# Patient Record
Sex: Female | Born: 1970 | Race: White | Hispanic: No | Marital: Single | Smoking: Current every day smoker
Health system: Southern US, Community
[De-identification: ages and names within clinical notes are randomized; demographics above are authoritative.]

## PROBLEM LIST (undated history)

## (undated) DIAGNOSIS — N390 Urinary tract infection, site not specified: Secondary | ICD-10-CM

## (undated) DIAGNOSIS — F329 Major depressive disorder, single episode, unspecified: Secondary | ICD-10-CM

## (undated) DIAGNOSIS — G43909 Migraine, unspecified, not intractable, without status migrainosus: Secondary | ICD-10-CM

## (undated) DIAGNOSIS — R002 Palpitations: Secondary | ICD-10-CM

## (undated) DIAGNOSIS — N200 Calculus of kidney: Secondary | ICD-10-CM

## (undated) HISTORY — PX: LAPAROSCOPIC GASTRIC SLEEVE RESECTION: SHX5895

## (undated) HISTORY — PX: CHOLECYSTECTOMY: SHX55

## (undated) HISTORY — PX: APPENDECTOMY: SHX54

## (undated) HISTORY — PX: TUBAL LIGATION: SHX77

---

## 1998-07-18 ENCOUNTER — Inpatient Hospital Stay (HOSPITAL_COMMUNITY): Admission: AD | Admit: 1998-07-18 | Discharge: 1998-07-18 | Payer: Self-pay | Admitting: Obstetrics and Gynecology

## 1998-07-19 ENCOUNTER — Inpatient Hospital Stay (HOSPITAL_COMMUNITY): Admission: AD | Admit: 1998-07-19 | Discharge: 1998-07-21 | Payer: Self-pay | Admitting: Obstetrics & Gynecology

## 1998-08-16 ENCOUNTER — Encounter: Payer: Self-pay | Admitting: Emergency Medicine

## 1998-08-16 ENCOUNTER — Emergency Department (HOSPITAL_COMMUNITY): Admission: EM | Admit: 1998-08-16 | Discharge: 1998-08-16 | Payer: Self-pay | Admitting: Emergency Medicine

## 1998-08-26 ENCOUNTER — Observation Stay (HOSPITAL_COMMUNITY): Admission: RE | Admit: 1998-08-26 | Discharge: 1998-08-27 | Payer: Self-pay | Admitting: General Surgery

## 1998-12-27 ENCOUNTER — Other Ambulatory Visit: Admission: RE | Admit: 1998-12-27 | Discharge: 1998-12-27 | Payer: Self-pay | Admitting: Obstetrics and Gynecology

## 1999-01-16 ENCOUNTER — Ambulatory Visit (HOSPITAL_COMMUNITY): Admission: RE | Admit: 1999-01-16 | Discharge: 1999-01-16 | Payer: Self-pay | Admitting: Unknown Physician Specialty

## 1999-11-03 ENCOUNTER — Encounter (INDEPENDENT_AMBULATORY_CARE_PROVIDER_SITE_OTHER): Payer: Self-pay

## 1999-11-03 ENCOUNTER — Inpatient Hospital Stay (HOSPITAL_COMMUNITY): Admission: AD | Admit: 1999-11-03 | Discharge: 1999-11-06 | Payer: Self-pay | Admitting: Obstetrics and Gynecology

## 1999-12-04 ENCOUNTER — Other Ambulatory Visit: Admission: RE | Admit: 1999-12-04 | Discharge: 1999-12-04 | Payer: Self-pay | Admitting: Obstetrics and Gynecology

## 2001-05-16 ENCOUNTER — Encounter: Payer: Self-pay | Admitting: Emergency Medicine

## 2001-05-16 ENCOUNTER — Emergency Department (HOSPITAL_COMMUNITY): Admission: EM | Admit: 2001-05-16 | Discharge: 2001-05-16 | Payer: Self-pay | Admitting: Emergency Medicine

## 2001-10-17 ENCOUNTER — Other Ambulatory Visit: Admission: RE | Admit: 2001-10-17 | Discharge: 2001-10-17 | Payer: Self-pay | Admitting: Obstetrics and Gynecology

## 2001-10-17 ENCOUNTER — Other Ambulatory Visit: Admission: RE | Admit: 2001-10-17 | Discharge: 2001-10-17 | Payer: Self-pay | Admitting: Gynecology

## 2001-12-22 ENCOUNTER — Encounter: Payer: Self-pay | Admitting: *Deleted

## 2001-12-22 ENCOUNTER — Encounter: Admission: RE | Admit: 2001-12-22 | Discharge: 2001-12-22 | Payer: Self-pay | Admitting: *Deleted

## 2001-12-23 ENCOUNTER — Ambulatory Visit (HOSPITAL_COMMUNITY): Admission: AD | Admit: 2001-12-23 | Discharge: 2001-12-23 | Payer: Self-pay | Admitting: Urology

## 2002-04-28 ENCOUNTER — Ambulatory Visit (HOSPITAL_COMMUNITY): Admission: RE | Admit: 2002-04-28 | Discharge: 2002-04-28 | Payer: Self-pay | Admitting: Obstetrics and Gynecology

## 2002-10-27 ENCOUNTER — Ambulatory Visit (HOSPITAL_COMMUNITY): Admission: RE | Admit: 2002-10-27 | Discharge: 2002-10-27 | Payer: Self-pay | Admitting: Obstetrics and Gynecology

## 2003-09-06 ENCOUNTER — Ambulatory Visit (HOSPITAL_COMMUNITY): Admission: AD | Admit: 2003-09-06 | Discharge: 2003-09-06 | Payer: Self-pay | Admitting: Urology

## 2003-10-25 ENCOUNTER — Other Ambulatory Visit: Admission: RE | Admit: 2003-10-25 | Discharge: 2003-10-25 | Payer: Self-pay | Admitting: Obstetrics and Gynecology

## 2004-11-04 ENCOUNTER — Other Ambulatory Visit: Admission: RE | Admit: 2004-11-04 | Discharge: 2004-11-04 | Payer: Self-pay | Admitting: Obstetrics and Gynecology

## 2010-06-01 ENCOUNTER — Emergency Department (HOSPITAL_BASED_OUTPATIENT_CLINIC_OR_DEPARTMENT_OTHER): Admission: EM | Admit: 2010-06-01 | Discharge: 2010-06-01 | Payer: Self-pay | Admitting: Emergency Medicine

## 2010-12-04 LAB — DIFFERENTIAL
Basophils Absolute: 0.1 10*3/uL (ref 0.0–0.1)
Basophils Relative: 1 % (ref 0–1)
Eosinophils Absolute: 0.3 10*3/uL (ref 0.0–0.7)
Eosinophils Relative: 3 % (ref 0–5)
Lymphocytes Relative: 23 % (ref 12–46)
Lymphs Abs: 2.4 10*3/uL (ref 0.7–4.0)
Monocytes Absolute: 0.6 10*3/uL (ref 0.1–1.0)
Monocytes Relative: 6 % (ref 3–12)
Neutro Abs: 7.1 10*3/uL (ref 1.7–7.7)
Neutrophils Relative %: 67 % (ref 43–77)

## 2010-12-04 LAB — BASIC METABOLIC PANEL
BUN: 12 mg/dL (ref 6–23)
CO2: 25 mEq/L (ref 19–32)
Calcium: 9.1 mg/dL (ref 8.4–10.5)
Chloride: 108 mEq/L (ref 96–112)
Creatinine, Ser: 0.7 mg/dL (ref 0.4–1.2)
GFR calc Af Amer: >60 mL/min (ref >60)
GFR calc non Af Amer: >60 mL/min (ref >60)
Glucose, Bld: 94 mg/dL (ref 70–99)
Potassium: 4.1 mEq/L (ref 3.5–5.1)
Sodium: 141 mEq/L (ref 135–145)

## 2010-12-04 LAB — URINALYSIS, ROUTINE W REFLEX MICROSCOPIC
Bilirubin Urine: NEGATIVE
Glucose, UA: NEGATIVE mg/dL
Hgb urine dipstick: NEGATIVE
Ketones, ur: 15 mg/dL — AB
Nitrite: NEGATIVE
Protein, ur: NEGATIVE mg/dL
Specific Gravity, Urine: 1.017 (ref 1.005–1.030)
Urobilinogen, UA: 0.2 mg/dL (ref 0.0–1.0)
pH: 6.5 (ref 5.0–8.0)

## 2010-12-04 LAB — CBC
HCT: 39.8 % (ref 36.0–46.0)
Hemoglobin: 13.5 g/dL (ref 12.0–15.0)
MCH: 31.1 pg (ref 26.0–34.0)
MCHC: 33.9 g/dL (ref 30.0–36.0)
MCV: 91.7 fL (ref 78.0–100.0)
Platelets: 248 10*3/uL (ref 150–400)
RBC: 4.34 MIL/uL (ref 3.87–5.11)
RDW: 13.1 % (ref 11.5–15.5)
WBC: 10.5 10*3/uL (ref 4.0–10.5)

## 2010-12-04 LAB — PREGNANCY, URINE: Preg Test, Ur: NEGATIVE

## 2011-02-06 NOTE — Op Note (Signed)
NAME:  Courtney Landry, Courtney Landry                      ACCOUNT NO.:  [id number]   MEDICAL RECORD NO.:  [id number]                   PATIENT TYPE:  AMB   LOCATION:  SDC                                  FACILITY:  [facility]   PHYSICIAN:  Miguel Aschoff, M.D.                    DATE OF BIRTH:  21-May-1971   DATE OF PROCEDURE:  10/27/2002  DATE OF DISCHARGE:                                 OPERATIVE REPORT   PREOPERATIVE DIAGNOSES:  Extensive vulvar and perianal condyloma acuminata.   POSTOPERATIVE DIAGNOSES:  Extensive vulvar and perianal condyloma acuminata.   PROCEDURE:  Laser vaporization of condyloma acuminata.   SURGEON:  Miguel Aschoff, M.D.   ANESTHESIA:  General.   COMPLICATIONS:  None.   JUSTIFICATION:  The patient is a 40 year old white female who has had a  seven month history of condyloma acuminata which have been resistant to  treatment using podophyllin and liquid nitrogen.  The condylomata are  amassed at the area of the fourchette, vulvar creases, and then perianal  region and because of their resistance to therapy on the outpatient basis  she presents now to undergo CO2 vaporization of the vulvar condylomata.  Risks and benefits of the procedure were discussed with patient and informed  consent has been obtained.   PROCEDURE:  The patient was taken to the operating room.  Placed in the  supine position and general anesthesia was administered without difficulty.  She was then placed in the dorsal lithotomy position and prepped and draped  in the usual sterile fashion.  Wet towels were placed around the vulva and  below the buttocks and then using the CO2 laser with 15 watts of power with  a handheld wand each of the condylomata were vaporized without difficulty  and with care to keep the lasered area in the superficial layer of the skin.  All visible lesions were laser vaporized at the fourchette, along the vulva,  the labia, and in the perianal region.  The estimated blood loss was  less  than 5 mL.  The patient tolerated the procedure well.  Went to the recovery  room in satisfactory condition.  Plan is for the patient to be discharged  home.  Her medications for home include Tylox one q.3h. as needed for pain.  She is instructed to use sitz baths t.i.d. with sea salts and Silvadene  cream b.i.d. to the area.  Seen back in two weeks for follow-up examination.  She is to call if there are any problems such as fever, back pain, or heavy  bleeding.                                               Miguel Aschoff, M.D.    AR/MEDQ  D:  10/27/2002  T:  10/27/2002  Job:  [id number]

## 2011-02-06 NOTE — Op Note (Signed)
NAME:  Courtney Landry, Courtney Landry                      ACCOUNT NO.:  [id number]   MEDICAL RECORD NO.:  [id number]                   PATIENT TYPE:  AMB   LOCATION:  SDC                                  FACILITY:  [facility]   PHYSICIAN:  Miguel Aschoff, M.D.                    DATE OF BIRTH:  Jun 19, 1971   DATE OF PROCEDURE:  04/28/2002  DATE OF DISCHARGE:                                 OPERATIVE REPORT   PREOPERATIVE DIAGNOSES:  1. Desires sterilization.  2. Perineal condylomata acuminata.   POSTOPERATIVE DIAGNOSES:  1. Desires sterilization.  2. Perineal condylomata acuminata.  3. Multiple pelvic adhesions.   PROCEDURES:  1. Laparoscopy with laparoscopic tubal sterilization using cautery and     division.  2. Lysis of adhesions.  3. Cautery of perineal condylomata acuminata.   SURGEON:  Miguel Aschoff, M.D.   ANESTHESIA:  General.   COMPLICATIONS:  None.   JUSTIFICATION:  The patient is a 40 year old white female gravida 4, para 3-  0-1-2, requesting sterilization for socioeconomic reasons.  The risks and  benefits of the procedure were discussed with the patient.  She understands  that this should result in her being sterile but that this could not be  guaranteed.  In addition, the patient reported that she had some lesions  noted at the opening of the vagina and exam revealed the presence of  condylomata acuminata.  The patient has requested that these be treated at  the time of her tubal sterilization.   PROCEDURE:  The patient was taken to the operating room, placed in a supine  position, and general anesthesia was administered without difficulty.  After  this was done, she was placed in the dorsal lithotomy position, prepped and  draped in the usual sterile fashion.  The bladder was catheterized.  A Hulka  tenaculum was placed through the cervix and held.  Attention was then  directed to the umbilicus where a small infraumbilical incision was made.  Veress needle was then inserted and  the abdomen was insufflated with three  liters of CO2.  Following the insufflation, the trocars were placed followed  by the laparoscope itself.  Then under direct visualization, a second 5-mm  suprapubic port was established.  On examination, the patient was noted to  have a band of adhesions running from the omentum to the anterior abdominal  wall.  This was lysed without difficulty sharply.  In addition, the patient  had marked and thick adhesions of the anterior uterine wall and bladder dome  to the anterior abdominal wall.  This  is a result of the patient's three  prior cesarean sections.  These were thick in nature and these were not  treated at this time since the patient had no complaints of pelvic pain and  for fear of injury to adjacent structures such as the bladder.  At this  point, the uterus was elevated, the right  tube was brought into view,  cauterized in its midportion with bipolar cautery for about 3 cm, and then  the tube was divided with laparoscopic scissors.  Then, the identical  procedure was carried out on the left side with cauterization and division  of the tube with good separation and good hemostasis.  At this point, with  no other abnormalities being noted, this portion of the procedure was  completed, the CO2 was allowed to escape.  The small incisions were closed  using subcuticular 4-0 Vicryl and the port sites were injected with 0.25%  Marcaine.  Attention was then directed to the perineum where the patient had  multiple condylomata acuminata.  Using electrocautery with 50 watts of  cautery power, these lesions were removed without difficulty.  At this  point, the procedure was completed.  The patient was taken to the recovery  room in satisfactory condition.  Estimated blood loss was less than 20 cc.  The patient tolerated the procedure well.  Plans for the patient to be  discharged home.  She should be seen back in four weeks for follow up  examination.   Medications for home include Tylox 1 q.3h. as needed for pain,  Silvadene cream in the area of the cautery of the warts.                                                 Miguel Aschoff, M.D.    AR/MEDQ  D:  04/28/2002  T:  04/29/2002  Job:  [id number]

## 2011-02-06 NOTE — Op Note (Signed)
[facility]  Patient:    LYNDAL, ALAMILLO Visit Number: [id number] MRN: [id number]          Service Type: DSU Location: DAY Attending Physician:  Katherine Roan Dictated by:   Rozanna Boer., M.D. Proc. Date: 12/23/01 Admit Date:  12/23/2001                             Operative Report  PREOPERATIVE DIAGNOSIS:  Right distal ureteral stone.  POSTOPERATIVE DIAGNOSIS:  Right distal ureteral stone.  OPERATION:  Ureteroscopy with holmium lasertripsy and stone removal.  ANESTHESIA:  General.  SURGEON:  Rozanna Boer., M.D.  BRIEF HISTORY:  This 40 year old white female presented with acute flank pain yesterday, April 3. She had a CT scan which showed a right distal stone that had moved even further distally today. She is not doing well with pain medicine, throwing up, cannot keep these down, and has not been able to be controlled with even intramuscular pain medicine. She enters now for ureteroscopy and stone extraction.  DESCRIPTION OF PROCEDURE:  The patient was placed on the operating room table in the dorsal lithotomy position after satisfactory induction of general anesthesia, was prepped and draped with Betadine in the usual sterile fashion and given IV antibiotics. Cystoscope was inserted and the stone was seen just emerging from the right ureteral orifice. It was somewhat wedged in there, so I calibrated the laser and at 0.5 watts with the 300 micron fiber, with the ureteroscope I was able to break the down into at least two pieces and then was able to tease these out. The 6 short ureteroscope was then able to be passed into the distal part of the urethra, no further stone fragments were seen. Because I did not have to dilate her ureter, I did not leave a stent. I drained the bladder, picked out the stones, and gave her a B&O suppository. She was sent to the recovery room in good condition to be later  discharged as an outpatient. Dictated by:   Rozanna Boer., M.D. Attending Physician:  Katherine Roan DD:  12/23/01 TD:  12/24/01 Job: [id number]

## 2011-02-06 NOTE — Op Note (Signed)
NAME:  Courtney Landry, Courtney Landry                      ACCOUNT NO.:  [id number]   MEDICAL RECORD NO.:  [id number]                   PATIENT TYPE:  AMB   LOCATION:  DAY                                  FACILITY:  [facility]   PHYSICIAN:  Rozanna Boer., M.D.      DATE OF BIRTH:  1971/06/25   DATE OF PROCEDURE:  09/06/2003  DATE OF DISCHARGE:                                 OPERATIVE REPORT   PREOPERATIVE DIAGNOSIS:  Left distal ureteral stone, 8 mm with obstruction.   POSTOPERATIVE DIAGNOSIS:  Left distal ureteral stone, 8 mm with obstruction.   OPERATION:  Cystourethroscopy with stone removal and insertion of left  ureteral stent.   ANESTHESIA:  General.   SURGEON:  Courtney Paris, M.D.   BRIEF HISTORY:  This 40 year old patient is admitted with left flank pain of  one week's duration, worse today with nausea and vomiting.  CT scan showed  an 8 mm left distal stone with obstruction.  She had had previous  ureteroscopy on the right, April 2003.  She had another procedure in 1994 on  the right, had a percutaneous nephrolithotomy at that time, had a left  ureteroscopy November 1991 and May 1988 on the left side.  She has no  cardiac or pulmonary symptomatology, no drug allergies.  No medications.   DESCRIPTION OF PROCEDURE:  The patient was placed on the operating table in  dorsal lithotomy position.  After satisfactory induction of general  anesthesia, was prepped and draped with Betadine in usual sterile fashion  given Ancef IV.  Bladder was inspected with the cystoscope.  The left  orifice was a little bit edematous, but the bladder was normal except for  some squamous metaplasia of the trigone.  No other mucosal lesions seen.  An  0.038 floppy tip guidewire was passed in the left orifice and then under  fluoroscopy, came up against the stone.  With gentle manipulation, I was  able to get past the stone up into the level of the kidney then over the  guidewire passed a 4 cm  balloon ureteral dilator.  This was inflated to 14  atmospheres for three timed minutes.  After this was done, the guidewire was  left in as a safety wire.  The 6 short ureteroscope was then passed in the  distal left ureter.  I could see the yellowish stone, got past it pretty  easily because the ureter was fairly dilated above this area.  Since I could  get past the stone on ureteroscopy, I thought I could basket the stone, so a  four-wire nitinol basket was then used, and it trapped and engaged the stone  and removed this intact.  Another pass with the scope revealed no further  fragments.  Over the guidewire, a 6 French 24 cm length double-J ureteral  stent was fashioned under fluoroscopy, and the guidewire was removed with  the stent in good position.  The string was left coming out through  the  urethra and taped to her lower abdomen.  The patient was then taken to the  recovery room after a B&O suppository was inserted where she will be later  discharged as an outpatient.  She will return tomorrow, and I will remove  the stent in the office at that time.                                               Rozanna Boer., M.D.    HMK/MEDQ  D:  09/06/2003  T:  09/07/2003  Job:  [id number]

## 2011-02-06 NOTE — Op Note (Signed)
[facility]  Patient:    Courtney Landry, Courtney Landry                   MRN: [id number] Proc. Date: 11/04/99 Adm. Date:  [id number] Attending:  Marcelle Overlie                           Operative Report  PREOPERATIVE DIAGNOSIS: 1. Intrauterine fetal demise at 33-1/2 weeks estimated gestational age. 2. History of two prior cesarean sections. 3. Patient refused attempted vaginal birth after cesarean section.  POSTOPERATIVE DIAGNOSIS: 1. Intrauterine fetal demise at 33-1/2 weeks estimated gestational age. 2. History of two prior cesarean sections. 3. Patient refused attempted vaginal birth after cesarean section.  OPERATION:  Repeat low transverse cesarean section.  SURGEON:  Mark E. Dareen Piano, M.D.  ANESTHESIA:  Epidural.  DRAINS:  Foley bedside drainage.  ANTIBIOTICS:  Ancef 1 gm.  ESTIMATED BLOOD LOSS:  800 cc.  COMPLICATIONS:  None.  FINDINGS:  The patient had normal-appearing fallopian tubes, ovaries and uterus. The placenta appeared normal.  The cord was normal in length.  It was infarcted  from the base of the insertion site on the female infant to midway to the cord.  The cord was hypercoiled as if the baby had rotated multiple times around the cord twisting it and cutting off the blood supply.  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  Patient delivered one dead female infant which appeared to be normal. There was evidence of liquifaction of the brain and some peeling of the skin on the abdomen and chest wall.  There was also a marginal amount of a foul odor arising from the dead fetus.  DESCRIPTION OF PROCEDURE:  The patient was taken to the operating room where she was placed in dorsal supine position with a left lateral tilt.  Her epidural anesthetic level was adequate and she was prepped with Betadine and draped in the usual fashion for this procedure.  A Pfannenstiel incision was made through the  previous scar.  This was  carried down to the fascia.  The fascia was entered in the midline and extended laterally.  The rectal muscles were then sharply dissected  from the fascia.  The rectus muscles were divided in the midline and taken superiorly and inferiorly.  The parietal peritoneum was then entered superiorly and it was noted that the bladder flap was adherent very high up on the anterior abdominal wall.  This was all taken down with sharp dissection.  Once this was accomplished there was a low transverse uterine incision made in the midline and extended laterally.  There was no amniotic fluid noted.  The fetus was delivered in the vertex presentation.  The cord doubly clamped and cut and the infant passed off the operative field.  The placenta was then manually removed.  It appeared that  there were two arteries and a vein in the umbilical cord.  The placenta was manually removed, the uterus exteriorized.  The uterine incision was closed in  single layer of 0 chromic in a running locking fashion.  The uterus was then placed back in the abdominal cavity.  The abdominal gutters were irrigated.  The hemostasis was checked and felt to be adequate again.  The bladder flap was not  closed.  The parietal peritoneum was closed in vertical fashion using 3-0 chromic in a running manner.  The fascia was closed using 0 Monocryl in a running  manner. The subcuticular tissue was made hemostatic with a Bovie.  Stainless steel clips were used to close the skin.  The patient tolerated the procedure well and was taken to the recovery room in stable condition.  Instrument and lap counts correct x 3. DD:  11/04/99 TD:  11/04/99 Job: [id number]

## 2011-02-06 NOTE — Discharge Summary (Signed)
[facility]  Patient:    JERUSALEN, MATEJA                   MRN: [id number] Adm. Date:  [id number] Disc. Date: [id number] Attending:  Marcelle Overlie Dictator:   Leilani Able, P.A.                           Discharge Summary  FINAL DIAGNOSES:              1. Intrauterine fetal demise at 33-1/2 weeks                                  estimated gestational age.                               2. History of two previous cesarean sections. The                                  patient refused attempt at vaginal birth after                                  cesarean section.  PROCEDURES:                   Repeat low transverse cesarean section.  SURGEON:                      Mark E. Dareen Piano, M.D.  COMPLICATIONS:                None.  HISTORY OF PRESENT ILLNESS:   This 40 year old, G4, P2, presented to the office at 33-4/7 weeks, complaining of no fetal movement since that past Saturday.  She denied any bleeding, contractions, or rupture of membranes.  In the office, an ultrasound revealed an intrauterine fetal demise.  The patient was counseled at  that point and sent to labor and delivery for induction.  She had had a history of two cesarean sections.  HOSPITAL COURSE:              At this time, induction was recommended for the route of delivery, but the patient was advised that she may have a C-section at any time she would like.  Laboratory work was obtained.  The patient had an epidural and was comfortable.  She was started on Pitocin protocol.  Later in the day, the patient refused to continue with a VBAC and wanted to proceed with a cesarean section. At this point, the patient was taken to the operating room on November 04, 1999, by [facility] E. Dareen Piano, M.D., where a repeat low transverse cesarean section was performed for fetal demise.  The cord was noted to be infarcted and hypercoiled. There were no knots seen.  The placenta looked  normal at this point.  The fetus  appeared to be intact and was a female.  The patients postoperative course was complicated by some low temperatures.  The patient was felt ready for discharge on postoperative day #2.  DIET:                         She was sent  home on a regular diet.  ACTIVITIES:                   Told to decrease activities.  DISCHARGE MEDICATIONS:        Was given Motrin 600 mg #60 one every six hours as needed for pain.  Was given Tylox #61 every four hours as needed for pain.  Was  given Ambien 10 mg #10 one at night as needed for help sleep.  Was given Xanax mg #10 one three times a day as needed.  FOLLOW-UP:                    The patient was to follow up in the office in four weeks.  The patient did have support throughout this event and was sent home with support at discharge. DD:  11/28/99 TD:  11/29/99 Job: [id number]

## 2011-12-06 ENCOUNTER — Emergency Department (HOSPITAL_BASED_OUTPATIENT_CLINIC_OR_DEPARTMENT_OTHER)
Admission: EM | Admit: 2011-12-06 | Discharge: 2011-12-07 | Disposition: A | Discharge status: Home / Self Care | Attending: Emergency Medicine | Admitting: Emergency Medicine

## 2011-12-06 ENCOUNTER — Encounter (HOSPITAL_BASED_OUTPATIENT_CLINIC_OR_DEPARTMENT_OTHER): Payer: Self-pay | Admitting: *Deleted

## 2011-12-06 DIAGNOSIS — R109 Unspecified abdominal pain: Secondary | ICD-10-CM | POA: Insufficient documentation

## 2011-12-06 DIAGNOSIS — R300 Dysuria: Secondary | ICD-10-CM | POA: Insufficient documentation

## 2011-12-06 DIAGNOSIS — N200 Calculus of kidney: Secondary | ICD-10-CM | POA: Insufficient documentation

## 2011-12-06 DIAGNOSIS — I498 Other specified cardiac arrhythmias: Secondary | ICD-10-CM | POA: Insufficient documentation

## 2011-12-06 DIAGNOSIS — R10819 Abdominal tenderness, unspecified site: Secondary | ICD-10-CM | POA: Insufficient documentation

## 2011-12-06 HISTORY — DX: Calculus of kidney: N20.0

## 2011-12-06 HISTORY — DX: Urinary tract infection, site not specified: N39.0

## 2011-12-06 LAB — URINALYSIS, ROUTINE W REFLEX MICROSCOPIC
Bilirubin Urine: NEGATIVE
Glucose, UA: NEGATIVE mg/dL
Ketones, ur: NEGATIVE mg/dL
Leukocytes, UA: NEGATIVE
Nitrite: NEGATIVE
Protein, ur: NEGATIVE mg/dL
Specific Gravity, Urine: 1.012 (ref 1.005–1.030)
Urobilinogen, UA: 0.2 mg/dL (ref 0.0–1.0)
pH: 6.5 (ref 5.0–8.0)

## 2011-12-06 LAB — DIFFERENTIAL
Basophils Absolute: 0.1 10*3/uL (ref 0.0–0.1)
Basophils Relative: 0 % (ref 0–1)
Eosinophils Absolute: 0.6 10*3/uL (ref 0.0–0.7)
Eosinophils Relative: 5 % (ref 0–5)
Lymphocytes Relative: 40 % (ref 12–46)
Lymphs Abs: 5.5 10*3/uL — ABNORMAL HIGH (ref 0.7–4.0)
Monocytes Absolute: 1 10*3/uL (ref 0.1–1.0)
Monocytes Relative: 7 % (ref 3–12)
Neutro Abs: 6.6 10*3/uL (ref 1.7–7.7)
Neutrophils Relative %: 48 % (ref 43–77)

## 2011-12-06 LAB — CBC
HCT: 36.4 % (ref 36.0–46.0)
Hemoglobin: 12.8 g/dL (ref 12.0–15.0)
MCH: 30.1 pg (ref 26.0–34.0)
MCHC: 35.2 g/dL (ref 30.0–36.0)
MCV: 85.6 fL (ref 78.0–100.0)
Platelets: 262 10*3/uL (ref 150–400)
RBC: 4.25 MIL/uL (ref 3.87–5.11)
RDW: 13.3 % (ref 11.5–15.5)
WBC: 13.7 10*3/uL — ABNORMAL HIGH (ref 4.0–10.5)

## 2011-12-06 LAB — URINE MICROSCOPIC-ADD ON

## 2011-12-06 LAB — BASIC METABOLIC PANEL
BUN: 12 mg/dL (ref 6–23)
CO2: 20 mEq/L (ref 19–32)
Calcium: 8.9 mg/dL (ref 8.4–10.5)
Chloride: 109 mEq/L (ref 96–112)
Creatinine, Ser: 0.8 mg/dL (ref 0.50–1.10)
GFR calc Af Amer: >90 mL/min (ref >90)
GFR calc non Af Amer: >90 mL/min (ref >90)
Glucose, Bld: 124 mg/dL — ABNORMAL HIGH (ref 70–99)
Potassium: 5.9 mEq/L — ABNORMAL HIGH (ref 3.5–5.1)
Sodium: 139 mEq/L (ref 135–145)

## 2011-12-06 LAB — PREGNANCY, URINE: Preg Test, Ur: NEGATIVE

## 2011-12-06 MED ORDER — ONDANSETRON HCL 4 MG/2ML IJ SOLN
4.0000 mg | Freq: Once | INTRAMUSCULAR | Status: AC
  Administered 2011-12-06: 4 mg via INTRAVENOUS
  Filled 2011-12-06: qty 2

## 2011-12-06 MED ORDER — KETOROLAC TROMETHAMINE 30 MG/ML IJ SOLN
30.0000 mg | Freq: Once | INTRAMUSCULAR | Status: AC
  Administered 2011-12-06: 30 mg via INTRAVENOUS
  Filled 2011-12-06: qty 1

## 2011-12-06 MED ORDER — MORPHINE SULFATE 4 MG/ML IJ SOLN
4.0000 mg | Freq: Once | INTRAMUSCULAR | Status: AC
  Administered 2011-12-06: 4 mg via INTRAVENOUS
  Filled 2011-12-06: qty 1

## 2011-12-06 NOTE — ED Notes (Signed)
Right flank pain since Friday- states it feels like her bladder isn't emptying

## 2011-12-06 NOTE — ED Provider Notes (Signed)
History    This chart was scribed for Courtney Mccurley Courtney Cords, MD, MD by Courtney Landry. The patient was seen in room MH05 and the patient's care was started at 11:00PM:.   CSN: [id number]  Arrival date & time 12/06/11  2132   First MD Initiated Contact with Patient 12/06/11 2253      Chief Complaint  Patient presents with  . Flank Pain    (Consider location/radiation/quality/duration/timing/severity/associated sxs/prior treatment) Patient is a 41 y.o. female presenting with flank pain. The history is provided by the patient. No language interpreter was used.  Flank Pain This is a recurrent problem. The current episode started more than 2 days ago. The problem occurs constantly. The problem has not changed since onset.Pertinent negatives include no chest pain, no abdominal pain, no headaches and no shortness of breath. The symptoms are aggravated by nothing. The symptoms are relieved by nothing. She has tried nothing for the symptoms. The treatment provided no relief.   Courtney Landry is a 41 y.o. female who presents to the Emergency Department complaining of moderate right side flank pain onset 2 days ago. Pt denies radiation. Pt reports dysuria when finishing urination. Pt has had kidney infection and kidney stones in past. Pt had menstrual cycle 2 weeks ago. Pt reports that pain is 7/10. Denies diarrhea, blood in urine, eye problems, sore throat, cough, congestion, joints swelling. Vomited 1x today. Pain has been constant since onset. Pt has taken pain medication with minor relief. PERC negative  Past Medical History  Diagnosis Date  . Kidney stones   . UTI (urinary tract infection)     Past Surgical History  Procedure Date  . Cholecystectomy   . Cesarean section   . Tubal ligation     History reviewed. No pertinent family history.  History  Substance Use Topics  . Smoking status: Current Everyday Smoker  . Smokeless tobacco: Never Used  . Alcohol Use: No    OB History    Grav Para Term Preterm Abortions TAB SAB Ect Mult Living                  Review of Systems  Constitutional: Negative.   HENT: Negative.   Respiratory: Negative for shortness of breath.   Cardiovascular: Negative for chest pain.  Gastrointestinal: Negative.  Negative for abdominal pain.  Genitourinary: Positive for flank pain.  Musculoskeletal: Negative.   Neurological: Negative for headaches.  Hematological: Negative.   Psychiatric/Behavioral: Negative.   All other systems reviewed and are negative.    Allergies  Review of patient's allergies indicates no known allergies.  Home Medications   Current Outpatient Rx  Name Route Sig Dispense Refill  . ALPRAZOLAM 0.5 MG PO TABS Oral Take 0.5 mg by mouth at bedtime as needed. Patient uses this medication for anxiety.    . AMOXICILLIN 875 MG PO TABS Oral Take 875 mg by mouth 2 (two) times daily.    Marland Kitchen CLINDAMYCIN HCL 300 MG PO CAPS Oral Take 300 mg by mouth 3 (three) times daily.    . IBUPROFEN 800 MG PO TABS Oral Take 800 mg by mouth every 8 (eight) hours as needed. Patient used this medication for pain.      BP 119/75  Pulse 72  Temp(Src) 98.2 F (36.8 C) (Oral)  Resp 18  Ht 5\' 3"  (1.6 m)  Wt 183 lb (83.008 kg)  BMI 32.42 kg/m2  SpO2 99%  LMP 11/22/2011  Physical Exam  Nursing note and vitals reviewed. Constitutional: She is oriented  to person, place, and time. She appears well-developed and well-nourished. No distress.  HENT:  Head: Normocephalic and atraumatic.  Mouth/Throat: Oropharynx is clear and moist.       Trachea midline No lymphadenopathy  Uvula midline  Eyes: Conjunctivae are normal. Pupils are equal, round, and reactive to light.  Neck: Normal range of motion. Neck supple.  Cardiovascular: Normal rate, regular rhythm, normal heart sounds and intact distal pulses.   Pulmonary/Chest: Effort normal and breath sounds normal. No respiratory distress.       Clear in 4 lungs fields lungs clear of auscultation   Abdominal: Soft. Bowel sounds are normal. There is tenderness (suprapubic ).  Neurological: She is alert and oriented to person, place, and time.  Skin: Skin is warm and dry.  Psychiatric: She has a normal mood and affect. Her behavior is normal.    ED Course  Procedures (including critical care time) DIAGNOSTIC STUDIES: Oxygen Saturation is 99% on room air, normal by my interpretation.    COORDINATION OF CARE: 11:05PM EDP ordered medication: toradol 30 mg, morphine 4 mg, zofran 4 mg    Labs Reviewed  URINALYSIS, ROUTINE W REFLEX MICROSCOPIC - Abnormal; Notable for the following:    Hgb urine dipstick TRACE (*)    All other components within normal limits  URINE MICROSCOPIC-ADD ON - Abnormal; Notable for the following:    Squamous Epithelial / LPF MANY (*)    All other components within normal limits  CBC - Abnormal; Notable for the following:    WBC 13.7 (*)    All other components within normal limits  DIFFERENTIAL - Abnormal; Notable for the following:    Lymphs Abs 5.5 (*)    All other components within normal limits  BASIC METABOLIC PANEL - Abnormal; Notable for the following:    Potassium 5.9 (*)    Glucose, Bld 124 (*)    All other components within normal limits  PREGNANCY, URINE  CARDIAC PANEL(CRET KIN+CKTOT+MB+TROPI)   Ct Abdomen Pelvis Wo Contrast  12/07/2011  *RADIOLOGY REPORT*  Clinical Data: Right-sided flank pain  CT ABDOMEN AND PELVIS WITHOUT CONTRAST  Technique:  Multidetector CT imaging of the abdomen and pelvis was performed following the standard protocol without intravenous contrast.  Comparison: 05/28/2005.  Findings: Visualized lung bases are clear.  Liver is normal.  Spleen is normal.  Pancreas is normal.  The adrenal glands are normal.  There is mild right hydronephrosis and hydroureter.  Within the urinary bladder near the right UVJ there is a small stone measuring 3.6 mm.  There are too small stones identified within the inferior pole collecting system  of the left kidney measuring up to 3.6 mm, image 28.  No left hydronephrosis or hydroureter.  Gallbladder is unremarkable by CT.  No biliary ductal dilation.  Stomach and visualized large and small bowel are unremarkable.  Abdominal aorta normal is in caliber.  No significant lymphadenopathy.  No free fluid or abnormal fluid collections.  Appendix identified and normal.  Visualized colon and small bowel are unremarkable.  No free fluid or abnormal fluid collections.  No significant lymphadenopathy.  Urinary bladder is normal.  IMPRESSION:  1.  Small stone within the urinary bladder is identified near the right UVJ.  There is mild right hydronephrosis and hydroureter. 2.  Nonobstructing left renal calculi.  Original Report Authenticated By: Rosealee Albee, M.D.     No diagnosis found.  Patient had histaminergic reaction to morphine.    MDM   Date: 12/07/2011  Rate: 54  Rhythm: sinus bradycardia  QRS Axis: normal  Intervals: normal  ST/T Wave abnormalities: normal  Conduction Disutrbances:none  Narrative Interpretation:   Old EKG Reviewed: none available  I personally performed the services described in this documentation, which was scribed in my presence. The recorded information has been reviewed and considered.     Follow up with urology in 7 days return for any concerning symptoms.  Patient expresses understanding and agrees to follow up    Rikia Sukhu Courtney Cords, MD 12/07/11 [phone]

## 2011-12-07 ENCOUNTER — Emergency Department (INDEPENDENT_AMBULATORY_CARE_PROVIDER_SITE_OTHER)

## 2011-12-07 ENCOUNTER — Other Ambulatory Visit: Payer: Self-pay

## 2011-12-07 DIAGNOSIS — N1330 Unspecified hydronephrosis: Secondary | ICD-10-CM

## 2011-12-07 DIAGNOSIS — N210 Calculus in bladder: Secondary | ICD-10-CM

## 2011-12-07 DIAGNOSIS — N200 Calculus of kidney: Secondary | ICD-10-CM

## 2011-12-07 LAB — CARDIAC PANEL(CRET KIN+CKTOT+MB+TROPI)
CK, MB: 1.4 ng/mL (ref 0.3–4.0)
Relative Index: INVALID (ref 0.0–2.5)
Total CK: 77 U/L (ref 7–177)
Troponin I: <0.3 ng/mL (ref <0.30)

## 2011-12-07 MED ORDER — OXYCODONE-ACETAMINOPHEN 5-325 MG PO TABS: 1.0000 | ORAL_TABLET | Freq: Four times a day (QID) | ORAL | Status: AC | PRN

## 2011-12-07 NOTE — Discharge Instructions (Signed)
Kidney Stones Kidney stones (ureteral lithiasis) are solid masses that form inside your kidneys. The intense pain is caused by the stone moving through the kidney, ureter, bladder, and urethra (urinary tract). When the stone moves, the ureter starts to spasm around the stone. The stone is usually passed in the urine.  HOME CARE  Drink enough fluids to keep your pee (urine) clear or pale yellow. This helps to get the stone out.   Strain all pee through the provided strainer. Do not pee without peeing through the strainer, not even once. If you pee the stone out, catch it. The stone may be as small as a grain of salt. Take this to your doctor.   Only take medicine as told by your doctor.   Follow up with your doctor as told.   Get follow-up X-rays as told by your doctor.  GET HELP RIGHT AWAY IF:   Your pain does not get better with medicine.   You have a fever.   Your pain increases and gets worse over 18 hours.   You have new belly (abdominal) pain.   You feel faint or pass out.  MAKE SURE YOU:   Understand these instructions.   Will watch your condition.   Will get help right away if you are not doing well or get worse.  Document Released: 02/24/2008 Document Revised: 08/27/2011 Document Reviewed: 07/05/2009 [employer] Patient Information 2012 [employer]. 

## 2011-12-07 NOTE — ED Notes (Signed)
Patient transported to radiology

## 2011-12-07 NOTE — ED Notes (Signed)
Pt states that after morphine she began to feel like she had gas in her chest and pain in right shoulder

## 2012-05-24 ENCOUNTER — Encounter (HOSPITAL_BASED_OUTPATIENT_CLINIC_OR_DEPARTMENT_OTHER): Payer: Self-pay | Admitting: *Deleted

## 2012-05-24 ENCOUNTER — Emergency Department (HOSPITAL_BASED_OUTPATIENT_CLINIC_OR_DEPARTMENT_OTHER)

## 2012-05-24 ENCOUNTER — Emergency Department (HOSPITAL_BASED_OUTPATIENT_CLINIC_OR_DEPARTMENT_OTHER)
Admission: EM | Admit: 2012-05-24 | Discharge: 2012-05-24 | Disposition: A | Discharge status: Home / Self Care | Attending: Emergency Medicine | Admitting: Emergency Medicine

## 2012-05-24 DIAGNOSIS — R079 Chest pain, unspecified: Secondary | ICD-10-CM | POA: Insufficient documentation

## 2012-05-24 DIAGNOSIS — M79609 Pain in unspecified limb: Secondary | ICD-10-CM | POA: Insufficient documentation

## 2012-05-24 DIAGNOSIS — I4510 Unspecified right bundle-branch block: Secondary | ICD-10-CM | POA: Insufficient documentation

## 2012-05-24 DIAGNOSIS — R0602 Shortness of breath: Secondary | ICD-10-CM | POA: Insufficient documentation

## 2012-05-24 DIAGNOSIS — F17200 Nicotine dependence, unspecified, uncomplicated: Secondary | ICD-10-CM | POA: Insufficient documentation

## 2012-05-24 LAB — COMPREHENSIVE METABOLIC PANEL
ALT: 10 U/L (ref 0–35)
AST: 13 U/L (ref 0–37)
Albumin: 4 g/dL (ref 3.5–5.2)
Alkaline Phosphatase: 65 U/L (ref 39–117)
BUN: 7 mg/dL (ref 6–23)
CO2: 24 mEq/L (ref 19–32)
Calcium: 9.3 mg/dL (ref 8.4–10.5)
Chloride: 104 mEq/L (ref 96–112)
Creatinine, Ser: 0.8 mg/dL (ref 0.50–1.10)
GFR calc Af Amer: >90 mL/min (ref >90)
GFR calc non Af Amer: >90 mL/min (ref >90)
Glucose, Bld: 102 mg/dL — ABNORMAL HIGH (ref 70–99)
Potassium: 4 mEq/L (ref 3.5–5.1)
Sodium: 139 mEq/L (ref 135–145)
Total Bilirubin: 0.6 mg/dL (ref 0.3–1.2)
Total Protein: 7.2 g/dL (ref 6.0–8.3)

## 2012-05-24 LAB — CBC
HCT: 38.8 % (ref 36.0–46.0)
Hemoglobin: 13.5 g/dL (ref 12.0–15.0)
MCH: 29.8 pg (ref 26.0–34.0)
MCHC: 34.8 g/dL (ref 30.0–36.0)
MCV: 85.7 fL (ref 78.0–100.0)
Platelets: 280 10*3/uL (ref 150–400)
RBC: 4.53 MIL/uL (ref 3.87–5.11)
RDW: 13.7 % (ref 11.5–15.5)
WBC: 12.8 10*3/uL — ABNORMAL HIGH (ref 4.0–10.5)

## 2012-05-24 LAB — TROPONIN I
Troponin I: <0.3 ng/mL (ref <0.30)
Troponin I: <0.3 ng/mL (ref <0.30)

## 2012-05-24 LAB — D-DIMER, QUANTITATIVE: D-Dimer, Quant: <0.22 ug/mL-FEU (ref 0.00–0.48)

## 2012-05-24 MED ORDER — ASPIRIN 81 MG PO CHEW
324.0000 mg | CHEWABLE_TABLET | Freq: Once | ORAL | Status: AC
  Administered 2012-05-24: 324 mg via ORAL
  Filled 2012-05-24: qty 4

## 2012-05-24 MED ORDER — KETOROLAC TROMETHAMINE 30 MG/ML IJ SOLN
30.0000 mg | Freq: Once | INTRAMUSCULAR | Status: AC
  Administered 2012-05-24: 30 mg via INTRAVENOUS
  Filled 2012-05-24: qty 1

## 2012-05-24 NOTE — ED Provider Notes (Signed)
History     CSN: [id number]  Arrival date & time 05/24/12  0916   First MD Initiated Contact with Patient 05/24/12 0935      Chief Complaint  Patient presents with  . Chest Pain  . Shortness of Breath    (Consider location/radiation/quality/duration/timing/severity/associated sxs/prior treatment) HPI Patient is a 41 year old female who presents today complaining of left-sided chest pain that has been constant over the past 3 days. Patient noted this began to radiate into her left arm and also reports having some shortness of breath began today. Patient rates the pain as a 4/10. She describes it as a heaviness. Patient has seen a cardiologist in the past and had a stress test that she reports was negative probably 10 years ago. Patient denies history of diabetes, hypertension, hyperlipidemia.  She does report family history of pulmonary embolus as well as personal history of tobacco use. Patient has tried ibuprofen without relief of her symptoms. She also has tried medications for reflux and gas without relief. She reports the pain feels like when she has had "gas in her left shoulder before". Patient denies any abdominal pain, nausea, or vomiting. She denies any exogenous estrogen use or recent long trips. There are no other associated or modifying factors. Past Medical History  Diagnosis Date  . Kidney stones   . UTI (urinary tract infection)     Past Surgical History  Procedure Date  . Cholecystectomy   . Cesarean section   . Tubal ligation   . Appendectomy     History reviewed. No pertinent family history.  History  Substance Use Topics  . Smoking status: Current Everyday Smoker  . Smokeless tobacco: Never Used  . Alcohol Use: No    OB History    Grav Para Term Preterm Abortions TAB SAB Ect Mult Living                  Review of Systems  Constitutional: Negative.   HENT: Negative.   Eyes: Negative.   Respiratory: Positive for shortness of breath.     Cardiovascular: Positive for chest pain.  Gastrointestinal: Negative.   Genitourinary: Negative.   Musculoskeletal: Negative.   Skin: Negative.   Neurological: Negative.   Hematological: Negative.   Psychiatric/Behavioral: Negative.   All other systems reviewed and are negative.    Allergies  Review of patient's allergies indicates no known allergies.  Home Medications   Current Outpatient Rx  Name Route Sig Dispense Refill  . ALPRAZOLAM 0.5 MG PO TABS Oral Take 0.5 mg by mouth at bedtime as needed. Patient uses this medication for anxiety.    . AMOXICILLIN 875 MG PO TABS Oral Take 875 mg by mouth 2 (two) times daily.    Marland Kitchen CLINDAMYCIN HCL 300 MG PO CAPS Oral Take 300 mg by mouth 3 (three) times daily.    . IBUPROFEN 800 MG PO TABS Oral Take 800 mg by mouth every 8 (eight) hours as needed. Patient used this medication for pain.      BP 119/71  Pulse 85  Temp 98.2 F (36.8 C) (Oral)  Resp 16  Ht 5\' 3"  (1.6 m)  Wt 180 lb (81.647 kg)  BMI 31.89 kg/m2  SpO2 100%  LMP 04/30/2012  Physical Exam  Nursing note and vitals reviewed. GEN: Well-developed, well-nourished female in no distress HEENT: Atraumatic, normocephalic. Oropharynx clear without erythema EYES: PERRLA BL, no scleral icterus. NECK: Trachea midline, no meningismus CV: regular rate and rhythm. No murmurs, rubs, or gallops PULM: No  respiratory distress.  No crackles, wheezes, or rales. GI: soft, non-tender. No guarding, rebound, or tenderness. + bowel sounds  GU: deferred Neuro: cranial nerves grossly 2-12 intact, no abnormalities of strength or sensation, A and O x 3 MSK: Patient moves all 4 extremities symmetrically, no deformity, edema, or injury noted Skin: No rashes petechiae, purpura, or jaundice Psych: no abnormality of mood   ED Course  Procedures (including critical care time)  Indication: Chest pain Please note this EKG was reviewed extemporaneously by myself.   Date: 05/24/2012  Rate: 75   Rhythm: normal sinus rhythm  QRS Axis: normal  Intervals: normal  ST/T Wave abnormalities: normal  Conduction Disutrbances:Incomplete right bundle-branch block  Narrative Interpretation:   Old EKG Reviewed: unchanged      Labs Reviewed  CBC - Abnormal; Notable for the following:    WBC 12.8 (*)     All other components within normal limits  COMPREHENSIVE METABOLIC PANEL - Abnormal; Notable for the following:    Glucose, Bld 102 (*)     All other components within normal limits  TROPONIN I  D-DIMER, QUANTITATIVE  TROPONIN I   Dg Chest 2 View  05/24/2012  *RADIOLOGY REPORT*  Clinical Data: Chest pain and shortness of breath.  Left-sided chest pain radiating down to the left arm for the past 2 days.  CHEST - 2 VIEW  Comparison: No priors.  Findings: Lung volumes are normal.  No consolidative airspace disease.  No pleural effusions.  No pneumothorax.  No pulmonary nodule or mass noted.  Pulmonary vasculature and the cardiomediastinal silhouette are within normal limits.  Surgical clips project over the right upper quadrant of the abdomen, consistent with prior cholecystectomy.  IMPRESSION: 1. No radiographic evidence of acute cardiopulmonary disease. 2.  Status post cholecystectomy.   Original Report Authenticated By: Florencia Reasons, M.D.      1. Chest pain       MDM  Patient was evaluated by myself. Based on evaluation I have low suspicion for ACS the symptoms would've been quite atypical with patient experiencing them for 3 days constantly. Patient was hemodynamically stable upon presentation. She is a smoker and has family history of thromboembolic disease. Patient was hemodynamically stable throughout her visit. She was given aspirin upon arrival initial workup showed no pneumonia, no EKG changes, no elevation of troponin, no leukocytosis, no anemia, no renal compromise. Patient was treated for her symptoms with Toradol. She had improvement in her symptoms. Repeat troponin at 3  hours was negative. Patient has seen a cardiologist to [facility] previously. We discussed following up with them regarding possible repeat stress test as patient believes it has been 10 years since prior. Patient had monitors factors I felt safe discharging her today. Patient was discharged in good condition. She did have a negative d-dimer based on family history of thromboembolic disease. Patient was discharged in good condition.      Cyndra Numbers, MD 05/24/12 1441

## 2012-05-24 NOTE — ED Notes (Signed)
Patient states she has had left chest pain for the last 3 days.  States pain started in her left chest as constant tightness and is now radiating into her left arm.   Developed shortness of breath this morning.

## 2012-06-22 ENCOUNTER — Emergency Department (HOSPITAL_BASED_OUTPATIENT_CLINIC_OR_DEPARTMENT_OTHER)

## 2012-06-22 ENCOUNTER — Encounter (HOSPITAL_BASED_OUTPATIENT_CLINIC_OR_DEPARTMENT_OTHER): Payer: Self-pay | Admitting: Family Medicine

## 2012-06-22 ENCOUNTER — Emergency Department (HOSPITAL_BASED_OUTPATIENT_CLINIC_OR_DEPARTMENT_OTHER)
Admission: EM | Admit: 2012-06-22 | Discharge: 2012-06-22 | Disposition: A | Discharge status: Home / Self Care | Attending: Emergency Medicine | Admitting: Emergency Medicine

## 2012-06-22 DIAGNOSIS — F17200 Nicotine dependence, unspecified, uncomplicated: Secondary | ICD-10-CM | POA: Insufficient documentation

## 2012-06-22 DIAGNOSIS — J45909 Unspecified asthma, uncomplicated: Secondary | ICD-10-CM

## 2012-06-22 DIAGNOSIS — R05 Cough: Secondary | ICD-10-CM | POA: Insufficient documentation

## 2012-06-22 DIAGNOSIS — R059 Cough, unspecified: Secondary | ICD-10-CM | POA: Insufficient documentation

## 2012-06-22 MED ORDER — ALBUTEROL SULFATE (5 MG/ML) 0.5% IN NEBU
INHALATION_SOLUTION | RESPIRATORY_TRACT | Status: AC
  Filled 2012-06-22: qty 1

## 2012-06-22 MED ORDER — ALBUTEROL SULFATE (5 MG/ML) 0.5% IN NEBU
5.0000 mg | INHALATION_SOLUTION | Freq: Once | RESPIRATORY_TRACT | Status: AC
  Administered 2012-06-22: 5 mg via RESPIRATORY_TRACT

## 2012-06-22 MED ORDER — IPRATROPIUM BROMIDE 0.02 % IN SOLN
0.5000 mg | Freq: Once | RESPIRATORY_TRACT | Status: AC
  Administered 2012-06-22: 0.5 mg via RESPIRATORY_TRACT

## 2012-06-22 MED ORDER — IPRATROPIUM BROMIDE 0.02 % IN SOLN
RESPIRATORY_TRACT | Status: AC
  Filled 2012-06-22: qty 2.5

## 2012-06-22 MED ORDER — AZITHROMYCIN 250 MG PO TABS: ORAL_TABLET | ORAL | Status: DC

## 2012-06-22 MED ORDER — PREDNISONE 10 MG PO TABS: 50.0000 mg | ORAL_TABLET | Freq: Every day | ORAL | Status: DC

## 2012-06-22 NOTE — ED Provider Notes (Signed)
History     CSN: [id number]  Arrival date & time 06/22/12  4098   First MD Initiated Contact with Patient 06/22/12 [phone]      Chief Complaint  Patient presents with  . Cough    (Consider location/radiation/quality/duration/timing/severity/associated sxs/prior treatment) Patient is a 41 y.o. female presenting with cough.  Cough Pertinent negatives include no chest pain, no chills, no headaches, no rhinorrhea, no sore throat, no myalgias and no shortness of breath.   patient presents today with upper respiratory infection symptoms for 1-1/2 weeks. She was seen previously and started on prednisone and an inhaler. This was started in urgent care on Monday. She states the symptoms continue and the coughing is severe. She is not dyspneic except with coughing. She has not recently had a fever but did have fever during the episode. patie is dyspneic with coughing episodes. She has had posttussive otherwise is keeping fluids down. She has been a smoker but has not been smoking since the beginning of this. She has been taking her medicines as prescribed. nt with cough and congestion for 1-1/2 weeks. She was seen on Monday in urgent care and started on prednisone, albuterol, and Hycodan for cough. She states she continues to have severe cough at especially at night.She has has some sputum production, no fever or chills.  Past Medical History  Diagnosis Date  . Kidney stones   . UTI (urinary tract infection)     Past Surgical History  Procedure Date  . Cholecystectomy   . Cesarean section   . Tubal ligation   . Appendectomy     No family history on file.  History  Substance Use Topics  . Smoking status: Current Every Day Smoker  . Smokeless tobacco: Never Used  . Alcohol Use: No    OB History    Grav Para Term Preterm Abortions TAB SAB Ect Mult Living                  Review of Systems  Constitutional: Negative for fever, chills, activity change, appetite change and unexpected  weight change.  HENT: Negative for sore throat, rhinorrhea, neck pain, neck stiffness and sinus pressure.   Eyes: Negative for visual disturbance.  Respiratory: Positive for cough. Negative for shortness of breath.   Cardiovascular: Negative for chest pain and leg swelling.  Gastrointestinal: Negative for vomiting, abdominal pain, diarrhea and blood in stool.  Genitourinary: Negative for dysuria, urgency, frequency, vaginal discharge and difficulty urinating.  Musculoskeletal: Negative for myalgias, arthralgias and gait problem.  Skin: Negative for color change and rash.  Neurological: Negative for weakness, light-headedness and headaches.  Hematological: Does not bruise/bleed easily.  Psychiatric/Behavioral: Negative for dysphoric mood.    Allergies  Review of patient's allergies indicates no known allergies.  Home Medications   Current Outpatient Rx  Name Route Sig Dispense Refill  . ALBUTEROL SULFATE HFA 108 (90 BASE) MCG/ACT IN AERS Inhalation Inhale 2 puffs into the lungs every 6 (six) hours as needed.    Marland Kitchen HYDROCODONE-HOMATROPINE 5-1.5 MG/5ML PO SYRP Oral Take by mouth every 6 (six) hours as needed.    Marland Kitchen PREDNISONE (PAK) PO Oral Take by mouth.    . ALPRAZOLAM 0.5 MG PO TABS Oral Take 0.5 mg by mouth at bedtime as needed. Patient uses this medication for anxiety.    . AMOXICILLIN 875 MG PO TABS Oral Take 875 mg by mouth 2 (two) times daily.    Marland Kitchen CLINDAMYCIN HCL 300 MG PO CAPS Oral Take 300 mg by  mouth 3 (three) times daily.    . IBUPROFEN 800 MG PO TABS Oral Take 800 mg by mouth every 8 (eight) hours as needed. Patient used this medication for pain.      BP 133/89  Pulse 94  Temp 97.9 F (36.6 C) (Oral)  Resp 16  Ht 5\' 3"  (1.6 m)  Wt 175 lb (79.379 kg)  BMI 31.00 kg/m2  SpO2 96%  LMP 05/29/2012  Physical Exam  Nursing note and vitals reviewed. Constitutional: She appears well-developed and well-nourished.  HENT:  Head: Normocephalic and atraumatic.  Eyes:  Conjunctivae normal and EOM are normal. Pupils are equal, round, and reactive to light.  Neck: Normal range of motion. Neck supple.  Cardiovascular: Normal rate, regular rhythm, normal heart sounds and intact distal pulses.   Pulmonary/Chest: Effort normal. She has wheezes.       Patient with diffuse rhonchi  Abdominal: Soft. Bowel sounds are normal.  Musculoskeletal: Normal range of motion.  Neurological: She is alert.  Skin: Skin is warm and dry.  Psychiatric: She has a normal mood and affect. Thought content normal.    ED Course  Procedures (including critical care time)  Labs Reviewed - No data to display Dg Chest 2 View  06/22/2012  *RADIOLOGY REPORT*  Clinical Data: Cough, congestion  CHEST - 2 VIEW  Comparison: Chest x-Ronasia Isola of 05/24/2012  Findings: The lungs are clear.  Mediastinal contours appear normal. The heart is within normal limits in size.  No bony abnormality is seen.  IMPRESSION: Stable chest x-Kemuel Buchmann.  No active lung disease.   Original Report Authenticated By: Juline Patch, M.D.      No diagnosis found.    MDM  Patient with decreased rhonchi after nebulizer x2. She is given additional prednisone. Her prednisone will be increased from 48-60 a day. She is placed on a Z-Pak. She is advised to to not start smoking again. She is counseled start regarding smoking cessation        Hilario Quarry, MD 06/22/12 1105

## 2012-06-22 NOTE — ED Notes (Signed)
Pt c/o dry cough x 1 wk. Pt sts she went to PCP and diagnosed with bronchitis. Pt sts "taking prednisone, hyrdocodone cough med and albuterol inhaler". Pt sts meds are not helping and unable to sleep.

## 2012-06-22 NOTE — ED Notes (Signed)
Patient transported to X-ray ambulatory 

## 2013-01-16 ENCOUNTER — Emergency Department (HOSPITAL_BASED_OUTPATIENT_CLINIC_OR_DEPARTMENT_OTHER)
Admission: EM | Admit: 2013-01-16 | Discharge: 2013-01-16 | Disposition: A | Discharge status: Home / Self Care | Attending: Emergency Medicine | Admitting: Emergency Medicine

## 2013-01-16 ENCOUNTER — Encounter (HOSPITAL_BASED_OUTPATIENT_CLINIC_OR_DEPARTMENT_OTHER): Payer: Self-pay | Admitting: *Deleted

## 2013-01-16 DIAGNOSIS — H53149 Visual discomfort, unspecified: Secondary | ICD-10-CM | POA: Insufficient documentation

## 2013-01-16 DIAGNOSIS — Z79899 Other long term (current) drug therapy: Secondary | ICD-10-CM | POA: Insufficient documentation

## 2013-01-16 DIAGNOSIS — R112 Nausea with vomiting, unspecified: Secondary | ICD-10-CM | POA: Insufficient documentation

## 2013-01-16 DIAGNOSIS — Z87440 Personal history of urinary (tract) infections: Secondary | ICD-10-CM | POA: Insufficient documentation

## 2013-01-16 DIAGNOSIS — Z87442 Personal history of urinary calculi: Secondary | ICD-10-CM | POA: Insufficient documentation

## 2013-01-16 DIAGNOSIS — F17200 Nicotine dependence, unspecified, uncomplicated: Secondary | ICD-10-CM | POA: Insufficient documentation

## 2013-01-16 DIAGNOSIS — Z3202 Encounter for pregnancy test, result negative: Secondary | ICD-10-CM | POA: Insufficient documentation

## 2013-01-16 DIAGNOSIS — Z792 Long term (current) use of antibiotics: Secondary | ICD-10-CM | POA: Insufficient documentation

## 2013-01-16 DIAGNOSIS — G43909 Migraine, unspecified, not intractable, without status migrainosus: Secondary | ICD-10-CM | POA: Insufficient documentation

## 2013-01-16 HISTORY — DX: Migraine, unspecified, not intractable, without status migrainosus: G43.909

## 2013-01-16 LAB — URINALYSIS, ROUTINE W REFLEX MICROSCOPIC
Glucose, UA: NEGATIVE mg/dL
Hgb urine dipstick: NEGATIVE
Ketones, ur: 40 mg/dL — AB
Leukocytes, UA: NEGATIVE
Nitrite: NEGATIVE
Protein, ur: NEGATIVE mg/dL
Specific Gravity, Urine: 1.026 (ref 1.005–1.030)
Urobilinogen, UA: 0.2 mg/dL (ref 0.0–1.0)
pH: 6 (ref 5.0–8.0)

## 2013-01-16 LAB — PREGNANCY, URINE: Preg Test, Ur: NEGATIVE

## 2013-01-16 MED ORDER — KETOROLAC TROMETHAMINE 30 MG/ML IJ SOLN
30.0000 mg | Freq: Once | INTRAMUSCULAR | Status: AC
  Administered 2013-01-16: 30 mg via INTRAVENOUS
  Filled 2013-01-16: qty 1

## 2013-01-16 MED ORDER — SODIUM CHLORIDE 0.9 % IV BOLUS (SEPSIS)
1000.0000 mL | Freq: Once | INTRAVENOUS | Status: AC
  Administered 2013-01-16: 1000 mL via INTRAVENOUS

## 2013-01-16 MED ORDER — METOCLOPRAMIDE HCL 10 MG PO TABS: 10.0000 mg | ORAL_TABLET | Freq: Four times a day (QID) | ORAL | Status: DC

## 2013-01-16 MED ORDER — DIPHENHYDRAMINE HCL 50 MG/ML IJ SOLN
25.0000 mg | Freq: Once | INTRAMUSCULAR | Status: AC
  Administered 2013-01-16: 25 mg via INTRAVENOUS
  Filled 2013-01-16: qty 1

## 2013-01-16 MED ORDER — METOCLOPRAMIDE HCL 5 MG/ML IJ SOLN
10.0000 mg | Freq: Once | INTRAMUSCULAR | Status: AC
  Administered 2013-01-16: 10 mg via INTRAVENOUS
  Filled 2013-01-16: qty 2

## 2013-01-16 NOTE — ED Provider Notes (Signed)
History  This chart was scribed for Ethelda Chick, MD by Shari Heritage, ED Scribe. The patient was seen in room MH01/MH01. Patient's care was started at 1754.   CSN: [id number]  Arrival date & time 01/16/13  1701   First MD Initiated Contact with Patient 01/16/13 1754      Chief Complaint  Patient presents with  . Migraine    Patient is a 42 y.o. female presenting with headaches. The history is provided by the patient. No language interpreter was used.  Headache Pain location:  Frontal Radiates to:  Does not radiate Duration:  3 days Timing:  Constant Progression:  Unchanged Chronicity:  Recurrent Similar to prior headaches: yes   Context: bright light   Relieved by:  Nothing Ineffective treatments:  NSAIDs, prescription medications and resting in a darkened room Associated symptoms: nausea, photophobia and vomiting   Associated symptoms: no abdominal pain, no cough, no diarrhea and no fever     HPI Comments: Courtney Landry is a 42 y.o. female with a history of migraines who presents to the Emergency Department complaining of severe, non-radiating frontal headache onset 3 days ago. There is associated photophobia, nausea and vomiting. Patient reports that vomiting began 2 days ago. Patient denies fever, chest pain, abdominal pain, diarrhea, rash or any other symptoms.  Patient has taken ibuprofen 800 mg and tramadol without any relief. She states that current symptoms are similar to prior headaches. Other medical history includes kidney stones and UTI.   Past Medical History  Diagnosis Date  . Kidney stones   . UTI (urinary tract infection)   . Migraine     Past Surgical History  Procedure Laterality Date  . Cholecystectomy    . Cesarean section    . Tubal ligation    . Appendectomy      No family history on file.  History  Substance Use Topics  . Smoking status: Current Every Day Smoker -- 1.00 packs/day    Types: Cigarettes  . Smokeless tobacco: Never Used  .  Alcohol Use: No    OB History   Grav Para Term Preterm Abortions TAB SAB Ect Mult Living                  Review of Systems  Constitutional: Negative for fever and chills.  Eyes: Positive for photophobia.  Respiratory: Negative for cough and shortness of breath.   Cardiovascular: Negative for chest pain.  Gastrointestinal: Positive for nausea and vomiting. Negative for abdominal pain and diarrhea.  Skin: Negative for rash.  Neurological: Positive for headaches.  Psychiatric/Behavioral: Negative for confusion.  All other systems reviewed and are negative.    Allergies  Review of patient's allergies indicates no known allergies.  Home Medications   Current Outpatient Rx  Name  Route  Sig  Dispense  Refill  . TRAMADOL HCL PO   Oral   Take by mouth.         Marland Kitchen albuterol (PROVENTIL HFA;VENTOLIN HFA) 108 (90 BASE) MCG/ACT inhaler   Inhalation   Inhale 2 puffs into the lungs every 6 (six) hours as needed.         . ALPRAZolam (XANAX) 0.5 MG tablet   Oral   Take 0.5 mg by mouth at bedtime as needed. Patient uses this medication for anxiety.         Marland Kitchen amoxicillin (AMOXIL) 875 MG tablet   Oral   Take 875 mg by mouth 2 (two) times daily.         Marland Kitchen  azithromycin (ZITHROMAX Z-PAK) 250 MG tablet      Use per package instructions   6 each   0   . clindamycin (CLEOCIN) 300 MG capsule   Oral   Take 300 mg by mouth 3 (three) times daily.         Marland Kitchen HYDROcodone-homatropine (HYCODAN) 5-1.5 MG/5ML syrup   Oral   Take by mouth every 6 (six) hours as needed.         Marland Kitchen ibuprofen (ADVIL,MOTRIN) 800 MG tablet   Oral   Take 800 mg by mouth every 8 (eight) hours as needed. Patient used this medication for pain.         Marland Kitchen metoCLOPramide (REGLAN) 10 MG tablet   Oral   Take 1 tablet (10 mg total) by mouth every 6 (six) hours.   30 tablet   0   . predniSONE (DELTASONE) 10 MG tablet   Oral   Take 5 tablets (50 mg total) by mouth daily.   6 tablet   0   .  PREDNISONE, PAK, PO   Oral   Take by mouth.           Triage Vitals: BP 107/69  Pulse 74  Temp(Src) 98.2 F (36.8 C) (Oral)  Resp 16  Wt 160 lb (72.576 kg)  BMI 28.35 kg/m2  SpO2 95%  Physical Exam  Constitutional: She is oriented to person, place, and time. She appears well-developed and well-nourished.  HENT:  Head: Normocephalic and atraumatic.  Eyes: Conjunctivae and EOM are normal. Pupils are equal, round, and reactive to light.  Neck: Normal range of motion. Neck supple.  Cardiovascular: Normal rate, regular rhythm and normal heart sounds.   Pulmonary/Chest: Effort normal and breath sounds normal.  Abdominal: Soft.  Musculoskeletal: Normal range of motion.  Neurological: She is alert and oriented to person, place, and time. She has normal reflexes.  CN 2-12 tested and intact. 5/5 strength in all extremities. 5/5 grip strength. Sensation fully intact. Intact reflexes.  Skin: Skin is warm and dry. No rash noted.    ED Course  Procedures (including critical care time) DIAGNOSTIC STUDIES: Oxygen Saturation is 95% on room air, adequate by my interpretation.    COORDINATION OF CARE: 6:11 PM- Patient informed of current plan for treatment and evaluation and agrees with plan at this time.    Labs Reviewed  URINALYSIS, ROUTINE W REFLEX MICROSCOPIC - Abnormal; Notable for the following:    Bilirubin Urine SMALL (*)    Ketones, ur 40 (*)    All other components within normal limits  PREGNANCY, URINE    1. Migraine       MDM  Pt with hx of migraines presenting with migraine headache x 3 days, normal neuro exam, low suspicion for SAH, meningitis or other acute emergent condition.  Pt treated with migraine cocktail and IV hydration.  Feels improved upon discharge.  There are no other associated systemic symptoms, there are no other alleviating or modifying factors.       I personally performed the services described in this documentation, which was scribed in my  presence. The recorded information has been reviewed and is accurate.    Ethelda Chick, MD 01/16/13 2104

## 2013-01-16 NOTE — ED Notes (Signed)
Migraine headache x 3 days

## 2013-01-16 NOTE — ED Notes (Signed)
Instructed patient to try and obtain urine specimen

## 2015-07-01 ENCOUNTER — Encounter (HOSPITAL_BASED_OUTPATIENT_CLINIC_OR_DEPARTMENT_OTHER): Payer: Self-pay | Admitting: *Deleted

## 2015-07-01 ENCOUNTER — Emergency Department (HOSPITAL_BASED_OUTPATIENT_CLINIC_OR_DEPARTMENT_OTHER)

## 2015-07-01 ENCOUNTER — Emergency Department (HOSPITAL_BASED_OUTPATIENT_CLINIC_OR_DEPARTMENT_OTHER)
Admission: EM | Admit: 2015-07-01 | Discharge: 2015-07-01 | Disposition: A | Discharge status: Home / Self Care | Attending: Emergency Medicine | Admitting: Emergency Medicine

## 2015-07-01 DIAGNOSIS — Z87442 Personal history of urinary calculi: Secondary | ICD-10-CM | POA: Insufficient documentation

## 2015-07-01 DIAGNOSIS — Z720 Tobacco use: Secondary | ICD-10-CM | POA: Insufficient documentation

## 2015-07-01 DIAGNOSIS — Z79899 Other long term (current) drug therapy: Secondary | ICD-10-CM | POA: Insufficient documentation

## 2015-07-01 DIAGNOSIS — Z8679 Personal history of other diseases of the circulatory system: Secondary | ICD-10-CM | POA: Insufficient documentation

## 2015-07-01 DIAGNOSIS — R0602 Shortness of breath: Secondary | ICD-10-CM | POA: Insufficient documentation

## 2015-07-01 DIAGNOSIS — Z792 Long term (current) use of antibiotics: Secondary | ICD-10-CM | POA: Insufficient documentation

## 2015-07-01 DIAGNOSIS — Z87440 Personal history of urinary (tract) infections: Secondary | ICD-10-CM | POA: Diagnosis not present

## 2015-07-01 DIAGNOSIS — R079 Chest pain, unspecified: Secondary | ICD-10-CM | POA: Insufficient documentation

## 2015-07-01 DIAGNOSIS — R42 Dizziness and giddiness: Secondary | ICD-10-CM | POA: Insufficient documentation

## 2015-07-01 DIAGNOSIS — Z7952 Long term (current) use of systemic steroids: Secondary | ICD-10-CM | POA: Insufficient documentation

## 2015-07-01 DIAGNOSIS — R002 Palpitations: Secondary | ICD-10-CM | POA: Diagnosis present

## 2015-07-01 HISTORY — DX: Palpitations: R00.2

## 2015-07-01 LAB — BASIC METABOLIC PANEL
Anion gap: 4 — ABNORMAL LOW (ref 5–15)
BUN: 9 mg/dL (ref 6–20)
CO2: 26 mmol/L (ref 22–32)
Calcium: 8.9 mg/dL (ref 8.9–10.3)
Chloride: 106 mmol/L (ref 101–111)
Creatinine, Ser: 0.8 mg/dL (ref 0.44–1.00)
GFR calc Af Amer: >60 mL/min (ref >60)
GFR calc non Af Amer: >60 mL/min (ref >60)
Glucose, Bld: 99 mg/dL (ref 65–99)
Potassium: 3.5 mmol/L (ref 3.5–5.1)
Sodium: 136 mmol/L (ref 135–145)

## 2015-07-01 LAB — TROPONIN I: Troponin I: <0.03 ng/mL (ref <0.031)

## 2015-07-01 LAB — CBC WITH DIFFERENTIAL/PLATELET
Basophils Absolute: 0.1 10*3/uL (ref 0.0–0.1)
Basophils Relative: 0 %
Eosinophils Absolute: 0.7 10*3/uL (ref 0.0–0.7)
Eosinophils Relative: 6 %
HCT: 32.4 % — ABNORMAL LOW (ref 36.0–46.0)
Hemoglobin: 10.5 g/dL — ABNORMAL LOW (ref 12.0–15.0)
Lymphocytes Relative: 39 %
Lymphs Abs: 4.5 10*3/uL — ABNORMAL HIGH (ref 0.7–4.0)
MCH: 26.4 pg (ref 26.0–34.0)
MCHC: 32.4 g/dL (ref 30.0–36.0)
MCV: 81.6 fL (ref 78.0–100.0)
Monocytes Absolute: 1 10*3/uL (ref 0.1–1.0)
Monocytes Relative: 8 %
Neutro Abs: 5.4 10*3/uL (ref 1.7–7.7)
Neutrophils Relative %: 47 %
Platelets: 378 10*3/uL (ref 150–400)
RBC: 3.97 MIL/uL (ref 3.87–5.11)
RDW: 15.8 % — ABNORMAL HIGH (ref 11.5–15.5)
WBC: 11.6 10*3/uL — ABNORMAL HIGH (ref 4.0–10.5)

## 2015-07-01 NOTE — ED Notes (Signed)
Pt. Reports she drove here from [place] and her doctors are in [place].

## 2015-07-01 NOTE — ED Notes (Signed)
VS taken after pt went to [facility].

## 2015-07-01 NOTE — ED Notes (Signed)
Palpitations since June. She has had a cardiac workup and seen a cardiologist and they did not give her an answer as to why she is having it or treatment for it. SOB with exertion.

## 2015-07-01 NOTE — Discharge Instructions (Signed)
Follow up with your primary care provider in 2-3 days. Also follow up with your cardiologist in 1 week. Please return to the Emergency Department if symptoms worsen or new onset of chest pain, difficulty breathing, shortness of breath, numbness, tingling, weakness, syncope.

## 2015-07-01 NOTE — ED Notes (Signed)
ED Nurse first: Patient states that her HR is high and she is having chest pain. Patient has HR of 88 in nurse first. Months of chest pain worse today

## 2015-07-01 NOTE — ED Provider Notes (Signed)
CSN: [id number]     Arrival date & time 07/01/15  1303 History   First MD Initiated Contact with Patient 07/01/15 1326     Chief Complaint  Patient presents with  . Palpitations     (Consider location/radiation/quality/duration/timing/severity/associated sxs/prior Treatment) HPI Comments: Pt is a 44 yo female who presents to the ED with complaint of "racing heart". Pt reports she has been having episodes of tachycardia since June that last for appx. 10-15 min and are relieved with laying supine. She reports having tachycardia that occurred this morning with associated SOB and notes that this episode felt worse than her pervious episodes causing her to come to the ED. She notes the palpitations have improved since laying supine in the ED. Endorses associated lightheadedness and her chest feeling "heavy". Pt reports that the palpitations only seem to begin when she is up walking around or doing an activity. She notes she saw a cardiologist last month (Dr. Beverely Pace) and had a 24-holter monitor that was negative.    Past Medical History  Diagnosis Date  . Kidney stones   . UTI (urinary tract infection)   . Migraine   . Palpitation    Past Surgical History  Procedure Laterality Date  . Cholecystectomy    . Cesarean section    . Tubal ligation    . Appendectomy     No family history on file. Social History  Substance Use Topics  . Smoking status: Current Every Day Smoker -- 1.00 packs/day    Types: Cigarettes  . Smokeless tobacco: Never Used  . Alcohol Use: No   OB History    No data available     Review of Systems  Respiratory: Positive for shortness of breath.   Cardiovascular: Positive for chest pain and palpitations.  Neurological: Positive for light-headedness.  All other systems reviewed and are negative.     Allergies  Review of patient's allergies indicates no known allergies.  Home Medications   Prior to Admission medications   Medication Sig Start Date End Date  Taking? Authorizing Provider  DULoxetine HCl (CYMBALTA PO) Take by mouth.   Yes Historical Provider, MD  albuterol (PROVENTIL HFA;VENTOLIN HFA) 108 (90 BASE) MCG/ACT inhaler Inhale 2 puffs into the lungs every 6 (six) hours as needed.    Historical Provider, MD  ALPRAZolam Prudy Feeler) 0.5 MG tablet Take 0.5 mg by mouth at bedtime as needed. Patient uses this medication for anxiety.    Historical Provider, MD  amoxicillin (AMOXIL) 875 MG tablet Take 875 mg by mouth 2 (two) times daily.    Historical Provider, MD  azithromycin (ZITHROMAX Z-PAK) 250 MG tablet Use per package instructions 06/22/12   Margarita Grizzle, MD  clindamycin (CLEOCIN) 300 MG capsule Take 300 mg by mouth 3 (three) times daily.    Historical Provider, MD  HYDROcodone-homatropine [facility]) 5-1.5 MG/5ML syrup Take by mouth every 6 (six) hours as needed.    Historical Provider, MD  ibuprofen (ADVIL,MOTRIN) 800 MG tablet Take 800 mg by mouth every 8 (eight) hours as needed. Patient used this medication for pain.    Historical Provider, MD  metoCLOPramide (REGLAN) 10 MG tablet Take 1 tablet (10 mg total) by mouth every 6 (six) hours. 01/16/13   Jerelyn Scott, MD  predniSONE (DELTASONE) 10 MG tablet Take 5 tablets (50 mg total) by mouth daily. 06/22/12   Margarita Grizzle, MD  PREDNISONE, PAK, PO Take by mouth.    Historical Provider, MD  TRAMADOL HCL PO Take by mouth.    Historical  Provider, MD   BP 127/73 mmHg  Pulse 90  Temp(Src) 98 F (36.7 C) (Oral)  Resp 23  Ht  (1.6 m)  Wt 180 lb (81.647 kg)  BMI 31.89 kg/m2  SpO2 100%  LMP 06/28/2015 Physical Exam  Constitutional: She is oriented to person, place, and time. She appears well-developed and well-nourished.  HENT:  Head: Normocephalic and atraumatic.  Mouth/Throat: Oropharynx is clear and moist. No oropharyngeal exudate.  Eyes: Conjunctivae and EOM are normal. Right eye exhibits no discharge. Left eye exhibits no discharge. No scleral icterus.  Neck: Normal range of motion. Neck  supple.  Cardiovascular: Normal rate, regular rhythm, normal heart sounds and intact distal pulses.   No murmur heard. Pulmonary/Chest: Effort normal and breath sounds normal. No respiratory distress. She has no wheezes. She has no rales. She exhibits tenderness (mid-sternal chest wall TTP).  Abdominal: Soft. Bowel sounds are normal. She exhibits no distension and no mass. There is no tenderness. There is no rebound and no guarding.  Musculoskeletal: Normal range of motion. She exhibits no edema or tenderness.  Lymphadenopathy:    She has no cervical adenopathy.  Neurological: She is alert and oriented to person, place, and time.  Skin: Skin is warm and dry.  Nursing note and vitals reviewed.   ED Course  Procedures (including critical care time) Labs Review Labs Reviewed - No data to display  Imaging Review No results found. I have personally reviewed and evaluated these images and lab results as part of my medical decision-making.   EKG Interpretation   Date/Time:  Monday July 01 2015 13:13:02 EDT Ventricular Rate:  91 PR Interval:  140 QRS Duration: 92 QT Interval:  364 QTC Calculation: 447 R Axis:   50 Text Interpretation:  Normal sinus rhythm Incomplete right bundle branch  block Borderline ECG SINCE LAST TRACING HEART RATE HAS INCREASED Confirmed  by BELFI  MD, MELANIE ([id number]) on 07/01/2015 1:14:29 PM      MDM   Final diagnoses:  Palpitations    Pt presents with "heart racing". Endorses associated SOB and chest heaviness. She reports having episodes daily since June that last 10-34min and only occur with walking or doing an activity. Pt had cardiac workup done by Dr. Beverely Pace last month with TSH, lipid panel, blood work and 24-holter monitor which were unremarkable. VSS. HR in the 80s in triage and during exam. Mid-sternal chest wall TTP, remaining exam benign. Lungs CTAB, no respiratory distress. Pt reports her palpitations have improved since laying in the bed in  the ED.  CBC and BMP unremarkable. Troponin <0.03. CXR showed vague rounded density over the left upper lung which was thought to most certainly be an ECG lead. Lordotic chest xray ordered for further assessment of chest for pulmonary nodule. Lordotic CXR negative. Pt is PERC negative. I have a low suspicion for ACS, PE, dissection, or other acute cardiac event at this time. Discussed results with pt and plan for d/c home. Pt advised to follow up with her cardiologist.  Evaluation does not show pathology requring ongoing emergent intervention or admission. Pt is hemodynamically stable and mentating appropriately. Discussed findings/results and plan with patient/guardian, who agrees with plan. All questions answered. Return precautions discussed and outpatient follow up given.      Satira Sark [place], [place] 07/01/15 1544  Rolan Bucco, MD 07/01/15 [id number]

## 2015-07-01 NOTE — ED Notes (Signed)
Patient transported to radiology

## 2015-09-13 DIAGNOSIS — F419 Anxiety disorder, unspecified: Secondary | ICD-10-CM | POA: Insufficient documentation

## 2015-09-28 ENCOUNTER — Emergency Department (HOSPITAL_BASED_OUTPATIENT_CLINIC_OR_DEPARTMENT_OTHER)
Admission: EM | Admit: 2015-09-28 | Discharge: 2015-09-28 | Disposition: A | Discharge status: Home / Self Care | Attending: Physician Assistant | Admitting: Physician Assistant

## 2015-09-28 ENCOUNTER — Encounter (HOSPITAL_BASED_OUTPATIENT_CLINIC_OR_DEPARTMENT_OTHER): Payer: Self-pay

## 2015-09-28 ENCOUNTER — Emergency Department (HOSPITAL_BASED_OUTPATIENT_CLINIC_OR_DEPARTMENT_OTHER)

## 2015-09-28 DIAGNOSIS — Z79899 Other long term (current) drug therapy: Secondary | ICD-10-CM | POA: Insufficient documentation

## 2015-09-28 DIAGNOSIS — Z87442 Personal history of urinary calculi: Secondary | ICD-10-CM | POA: Diagnosis not present

## 2015-09-28 DIAGNOSIS — R079 Chest pain, unspecified: Secondary | ICD-10-CM | POA: Diagnosis not present

## 2015-09-28 DIAGNOSIS — F17210 Nicotine dependence, cigarettes, uncomplicated: Secondary | ICD-10-CM | POA: Insufficient documentation

## 2015-09-28 DIAGNOSIS — R000 Tachycardia, unspecified: Secondary | ICD-10-CM | POA: Diagnosis not present

## 2015-09-28 DIAGNOSIS — G43909 Migraine, unspecified, not intractable, without status migrainosus: Secondary | ICD-10-CM | POA: Diagnosis not present

## 2015-09-28 DIAGNOSIS — R112 Nausea with vomiting, unspecified: Secondary | ICD-10-CM | POA: Diagnosis not present

## 2015-09-28 DIAGNOSIS — Z87440 Personal history of urinary (tract) infections: Secondary | ICD-10-CM | POA: Insufficient documentation

## 2015-09-28 DIAGNOSIS — J189 Pneumonia, unspecified organism: Secondary | ICD-10-CM

## 2015-09-28 DIAGNOSIS — J159 Unspecified bacterial pneumonia: Secondary | ICD-10-CM | POA: Insufficient documentation

## 2015-09-28 DIAGNOSIS — R05 Cough: Secondary | ICD-10-CM | POA: Diagnosis present

## 2015-09-28 LAB — COMPREHENSIVE METABOLIC PANEL
ALT: 22 U/L (ref 14–54)
AST: 30 U/L (ref 15–41)
Albumin: 4.4 g/dL (ref 3.5–5.0)
Alkaline Phosphatase: 86 U/L (ref 38–126)
Anion gap: 7 (ref 5–15)
BUN: 12 mg/dL (ref 6–20)
CO2: 20 mmol/L — ABNORMAL LOW (ref 22–32)
Calcium: 8.8 mg/dL — ABNORMAL LOW (ref 8.9–10.3)
Chloride: 110 mmol/L (ref 101–111)
Creatinine, Ser: 0.87 mg/dL (ref 0.44–1.00)
GFR calc Af Amer: >60 mL/min (ref >60)
GFR calc non Af Amer: >60 mL/min (ref >60)
Glucose, Bld: 132 mg/dL — ABNORMAL HIGH (ref 65–99)
Potassium: 4.5 mmol/L (ref 3.5–5.1)
Sodium: 137 mmol/L (ref 135–145)
Total Bilirubin: 0.8 mg/dL (ref 0.3–1.2)
Total Protein: 8.6 g/dL — ABNORMAL HIGH (ref 6.5–8.1)

## 2015-09-28 LAB — CBC WITH DIFFERENTIAL/PLATELET
Basophils Absolute: 0 10*3/uL (ref 0.0–0.1)
Basophils Relative: 0 %
Eosinophils Absolute: 0 10*3/uL (ref 0.0–0.7)
Eosinophils Relative: 0 %
HCT: 35.9 % — ABNORMAL LOW (ref 36.0–46.0)
Hemoglobin: 11.7 g/dL — ABNORMAL LOW (ref 12.0–15.0)
Lymphocytes Relative: 10 %
Lymphs Abs: 2 10*3/uL (ref 0.7–4.0)
MCH: 25.5 pg — ABNORMAL LOW (ref 26.0–34.0)
MCHC: 32.6 g/dL (ref 30.0–36.0)
MCV: 78.4 fL (ref 78.0–100.0)
Monocytes Absolute: 0.7 10*3/uL (ref 0.1–1.0)
Monocytes Relative: 4 %
Neutro Abs: 16.3 10*3/uL — ABNORMAL HIGH (ref 1.7–7.7)
Neutrophils Relative %: 86 %
Platelets: 374 10*3/uL (ref 150–400)
RBC: 4.58 MIL/uL (ref 3.87–5.11)
RDW: 17.2 % — ABNORMAL HIGH (ref 11.5–15.5)
WBC: 19 10*3/uL — ABNORMAL HIGH (ref 4.0–10.5)

## 2015-09-28 LAB — TROPONIN I: Troponin I: <0.03 ng/mL (ref <0.031)

## 2015-09-28 MED ORDER — ALBUTEROL SULFATE HFA 108 (90 BASE) MCG/ACT IN AERS
2.0000 | INHALATION_SPRAY | Freq: Once | RESPIRATORY_TRACT | Status: AC
  Administered 2015-09-28: 2 via RESPIRATORY_TRACT
  Filled 2015-09-28: qty 6.7

## 2015-09-28 MED ORDER — IPRATROPIUM-ALBUTEROL 0.5-2.5 (3) MG/3ML IN SOLN
3.0000 mL | Freq: Once | RESPIRATORY_TRACT | Status: AC
  Administered 2015-09-28: 3 mL via RESPIRATORY_TRACT

## 2015-09-28 MED ORDER — ONDANSETRON HCL 4 MG/2ML IJ SOLN
4.0000 mg | Freq: Once | INTRAMUSCULAR | Status: AC
  Administered 2015-09-28: 4 mg via INTRAVENOUS
  Filled 2015-09-28: qty 2

## 2015-09-28 MED ORDER — AZITHROMYCIN 250 MG PO TABS: 250.0000 mg | ORAL_TABLET | Freq: Once | ORAL | Status: DC

## 2015-09-28 MED ORDER — ONDANSETRON HCL 4 MG PO TABS: 4.0000 mg | ORAL_TABLET | Freq: Three times a day (TID) | ORAL | Status: DC | PRN

## 2015-09-28 MED ORDER — IBUPROFEN 800 MG PO TABS: 800.0000 mg | ORAL_TABLET | Freq: Three times a day (TID) | ORAL | Status: DC

## 2015-09-28 MED ORDER — BENZONATATE 100 MG PO CAPS
100.0000 mg | ORAL_CAPSULE | Freq: Three times a day (TID) | ORAL | Status: DC | PRN
Start: 2015-09-28 — End: 2015-10-28

## 2015-09-28 MED ORDER — SODIUM CHLORIDE 0.9 % IV BOLUS (SEPSIS)
1000.0000 mL | Freq: Once | INTRAVENOUS | Status: AC
  Administered 2015-09-28: 1000 mL via INTRAVENOUS

## 2015-09-28 MED ORDER — KETOROLAC TROMETHAMINE 30 MG/ML IJ SOLN
30.0000 mg | Freq: Once | INTRAMUSCULAR | Status: AC
  Administered 2015-09-28: 30 mg via INTRAVENOUS
  Filled 2015-09-28: qty 1

## 2015-09-28 MED ORDER — AEROCHAMBER PLUS W/MASK MISC
1.0000 | Freq: Once | Status: AC
Start: 2015-09-28 — End: 2015-09-28
  Administered 2015-09-28: 1
  Filled 2015-09-28: qty 1

## 2015-09-28 MED ORDER — AZITHROMYCIN 250 MG PO TABS
500.0000 mg | ORAL_TABLET | Freq: Once | ORAL | Status: AC
  Administered 2015-09-28: 500 mg via ORAL
  Filled 2015-09-28: qty 2

## 2015-09-28 MED ORDER — ALBUTEROL SULFATE (2.5 MG/3ML) 0.083% IN NEBU
5.0000 mg | INHALATION_SOLUTION | Freq: Once | RESPIRATORY_TRACT | Status: AC
  Administered 2015-09-28: 5 mg via RESPIRATORY_TRACT
  Filled 2015-09-28: qty 6

## 2015-09-28 MED ORDER — GUAIFENESIN-CODEINE 100-10 MG/5ML PO SOLN: 5.0000 mL | Freq: Four times a day (QID) | ORAL | Status: DC | PRN

## 2015-09-28 NOTE — ED Provider Notes (Signed)
CSN: [id number]     Arrival date & time 09/28/15  1143 History   First MD Initiated Contact with Patient 09/28/15 1150     Chief Complaint  Patient presents with  . Cough  . Emesis     (Consider location/radiation/quality/duration/timing/severity/associated sxs/prior Treatment) HPI  Patient is a 45 year old female presenting with cough, congestion, emesis, affect fever for the last 5 days. Patient was seen at urgent care and diagnosed with virus. Patient did not have a flu shot this year. Patient reports difficulty taking medications at home because she is vomiting. She also reports increasing cough and shortness of breath.  Patient has no significant lung history. Patient has history of tachybradycardia syndrome. No sick contacts.  Past Medical History  Diagnosis Date  . Kidney stones   . UTI (urinary tract infection)   . Migraine   . Palpitation    Past Surgical History  Procedure Laterality Date  . Cholecystectomy    . Cesarean section    . Tubal ligation    . Appendectomy     No family history on file. Social History  Substance Use Topics  . Smoking status: Current Every Day Smoker -- 1.00 packs/day    Types: Cigarettes  . Smokeless tobacco: Never Used  . Alcohol Use: No   OB History    No data available     Review of Systems  Constitutional: Positive for fatigue. Negative for activity change.  HENT: Positive for congestion. Negative for ear pain.   Eyes: Negative for discharge.  Respiratory: Positive for cough and shortness of breath. Negative for chest tightness.   Cardiovascular: Positive for chest pain.  Gastrointestinal: Positive for nausea and vomiting. Negative for abdominal pain and abdominal distention.  Genitourinary: Negative for dysuria.  Musculoskeletal: Negative for joint swelling.  Skin: Negative for rash.  Allergic/Immunologic: Negative for immunocompromised state.  Neurological: Negative for seizures.  Psychiatric/Behavioral: Negative for  agitation.      Allergies  Review of patient's allergies indicates no known allergies.  Home Medications   Prior to Admission medications   Medication Sig Start Date End Date Taking? Authorizing Provider  atenolol (TENORMIN) 50 MG tablet Take 50 mg by mouth 2 (two) times daily.   Yes Historical Provider, MD  DULoxetine HCl (CYMBALTA PO) Take by mouth.   Yes Historical Provider, MD  ibuprofen (ADVIL,MOTRIN) 800 MG tablet Take 800 mg by mouth every 8 (eight) hours as needed. Patient used this medication for pain.   Yes Historical Provider, MD  oxymetazoline (AFRIN) 0.05 % nasal spray Place 1 spray into both nostrils 2 (two) times daily.   Yes Historical Provider, MD   BP 107/61 mmHg  Pulse 85  Temp(Src) 97.9 F (36.6 C) (Oral)  Resp 24  Ht 5\' 3"  (1.6 m)  Wt 203 lb (92.08 kg)  BMI 35.97 kg/m2  SpO2 97%  LMP 09/07/2015 (Approximate) Physical Exam  Constitutional: She is oriented to person, place, and time. She appears well-developed and well-nourished.  HENT:  Head: Normocephalic and atraumatic.  Dry mucous membranes.  Eyes: Conjunctivae are normal. Right eye exhibits no discharge.  Neck: Neck supple.  Cardiovascular: Regular rhythm and normal heart sounds.   No murmur heard. Tachycardic  Pulmonary/Chest: She has wheezes.  Right lung with crackles and rhonchi.  Abdominal: Soft. She exhibits no distension. There is no tenderness.  Musculoskeletal: Normal range of motion. She exhibits no edema.  Neurological: She is oriented to person, place, and time. No cranial nerve deficit.  Skin: Skin is warm and  dry. No rash noted. She is not diaphoretic.  Psychiatric: She has a normal mood and affect. Her behavior is normal.  Nursing note and vitals reviewed.   ED Course  Procedures (including critical care time) Labs Review Labs Reviewed  CBC WITH DIFFERENTIAL/PLATELET - Abnormal; Notable for the following:    WBC 19.0 (*)    Hemoglobin 11.7 (*)    HCT 35.9 (*)    MCH 25.5 (*)     RDW 17.2 (*)    Neutro Abs 16.3 (*)    All other components within normal limits  COMPREHENSIVE METABOLIC PANEL - Abnormal; Notable for the following:    CO2 20 (*)    Glucose, Bld 132 (*)    Calcium 8.8 (*)    Total Protein 8.6 (*)    All other components within normal limits  TROPONIN I    Imaging Review Dg Chest 2 View  09/28/2015  CLINICAL DATA:  Patient with cough, congestion and emesis. EXAM: CHEST  2 VIEW COMPARISON:  Chest radiograph 08/28/2015. FINDINGS: Normal cardiac and mediastinal contours. No consolidative pulmonary opacities. No pleural effusion or pneumothorax. Regional skeleton is unremarkable. IMPRESSION: No acute cardiopulmonary process. Electronically Signed   By: Annia Beltrew  Davis M.D.   On: 09/28/2015 12:17   I have personally reviewed and evaluated these images and lab results as part of my medical decision-making.   EKG Interpretation   Date/Time:  Saturday September 28 2015 12:16:52 EST Ventricular Rate:  74 PR Interval:  149 QRS Duration: 112 QT Interval:  382 QTC Calculation: 424 R Axis:   21 Text Interpretation:  Sinus rhythm Borderline intraventricular conduction  delay Low voltage, precordial leads Baseline wander in lead(s) II III aVF  V1 V6 no acute ischemia No significant change since last tracing Confirmed  by Kandis MannanMACKUEN, COURTNEY ([id number]) on 09/28/2015 12:37:49 PM      MDM   Final diagnoses:  None    Patient is a 45 year old female with past medical history significant for tachybradycardia syndrome presenting today with viral/flulike symptoms. Patient had these for 4-5 days. Patient does have mild rhonchi/wheezing in her right lung. Patient endorses nausea and vomiting as well. Patient is tachycardic on arrival here. We will give her fluids, Zofran, ibuprofen. We'll do chest x-ray for pneumonia. Strongly suspect clinical diagnosis of pneumonia we'll likely treat regardless. We will give a DuoNeb here to help clear lungs and then send home with  albuterol inhaler as well as symptomatically care such as Zofran and cough syrup.  1:20 PM No pna on xray.   1:20 PM Lung exam improved. Patient taking PO.  Will treat for CAP with symptomatic medications for home.       Kahmari Koller Randall AnLyn Cason Dabney, MD 09/28/15 1320

## 2015-09-28 NOTE — Discharge Instructions (Signed)
Please use inhalor every 4 hours to help with shortness of breath.  Use ibuprofena dand tyelnol for comfort.  Use zofran for nausea.  And cough syrup for NIGHT TIME cough.  Do not drink or drive with the cough syrup.  Community-Acquired Pneumonia, Adult Pneumonia is an infection of the lungs. One type of pneumonia can happen while a person is in a hospital. A different type can happen when a person is not in a hospital (community-acquired pneumonia). It is easy for this kind to spread from person to person. It can spread to you if you breathe near an infected person who coughs or sneezes. Some symptoms include:  A dry cough.  A wet (productive) cough.  Fever.  Sweating.  Chest pain. HOME CARE  Take over-the-counter and prescription medicines only as told by your doctor.  Only take cough medicine if you are losing sleep.  If you were prescribed an antibiotic medicine, take it as told by your doctor. Do not stop taking the antibiotic even if you start to feel better.  Sleep with your head and neck raised (elevated). You can do this by putting a few pillows under your head, or you can sleep in a recliner.  Do not use tobacco products. These include cigarettes, chewing tobacco, and e-cigarettes. If you need help quitting, ask your doctor.  Drink enough water to keep your pee (urine) clear or pale yellow. A shot (vaccine) can help prevent pneumonia. Shots are often suggested for:  People older than 45 years of age.  People older than 45 years of age:  Who are having cancer treatment.  Who have long-term (chronic) lung disease.  Who have problems with their body's defense system (immune system). You may also prevent pneumonia if you take these actions:  Get the flu (influenza) shot every year.  Go to the dentist as often as told.  Wash your hands often. If soap and water are not available, use hand sanitizer. GET HELP IF:  You have a fever.  You lose sleep because your  cough medicine does not help. GET HELP RIGHT AWAY IF:  You are short of breath and it gets worse.  You have more chest pain.  Your sickness gets worse. This is very serious if:  You are an older adult.  Your body's defense system is weak.  You cough up blood.   This information is not intended to replace advice given to you by your health care provider. Make sure you discuss any questions you have with your health care provider.   Document Released: 02/24/2008 Document Revised: 05/29/2015 Document Reviewed: 01/02/2015 Elsevier Interactive Patient Education [employer]2016 [employer] Inc[employer]

## 2015-09-28 NOTE — ED Notes (Signed)
Patient here with cough and vomiting since Monday, reports that she cannot keep anything down, hyperventilating on assessment, no acute distress

## 2015-10-28 ENCOUNTER — Encounter (HOSPITAL_BASED_OUTPATIENT_CLINIC_OR_DEPARTMENT_OTHER): Payer: Self-pay | Admitting: *Deleted

## 2015-10-28 ENCOUNTER — Emergency Department (HOSPITAL_BASED_OUTPATIENT_CLINIC_OR_DEPARTMENT_OTHER)
Admission: EM | Admit: 2015-10-28 | Discharge: 2015-10-28 | Disposition: A | Discharge status: Home / Self Care | Attending: Emergency Medicine | Admitting: Emergency Medicine

## 2015-10-28 DIAGNOSIS — Z87440 Personal history of urinary (tract) infections: Secondary | ICD-10-CM | POA: Diagnosis not present

## 2015-10-28 DIAGNOSIS — F17210 Nicotine dependence, cigarettes, uncomplicated: Secondary | ICD-10-CM | POA: Diagnosis not present

## 2015-10-28 DIAGNOSIS — Z87442 Personal history of urinary calculi: Secondary | ICD-10-CM | POA: Diagnosis not present

## 2015-10-28 DIAGNOSIS — R51 Headache: Secondary | ICD-10-CM | POA: Insufficient documentation

## 2015-10-28 DIAGNOSIS — Z3202 Encounter for pregnancy test, result negative: Secondary | ICD-10-CM | POA: Insufficient documentation

## 2015-10-28 DIAGNOSIS — R112 Nausea with vomiting, unspecified: Secondary | ICD-10-CM | POA: Diagnosis present

## 2015-10-28 DIAGNOSIS — J029 Acute pharyngitis, unspecified: Secondary | ICD-10-CM | POA: Insufficient documentation

## 2015-10-28 DIAGNOSIS — Z8679 Personal history of other diseases of the circulatory system: Secondary | ICD-10-CM | POA: Diagnosis not present

## 2015-10-28 LAB — URINALYSIS, ROUTINE W REFLEX MICROSCOPIC
Bilirubin Urine: NEGATIVE
Glucose, UA: NEGATIVE mg/dL
Ketones, ur: NEGATIVE mg/dL
Leukocytes, UA: NEGATIVE
Nitrite: NEGATIVE
Protein, ur: NEGATIVE mg/dL
Specific Gravity, Urine: 1.026 (ref 1.005–1.030)
pH: 6.5 (ref 5.0–8.0)

## 2015-10-28 LAB — URINE MICROSCOPIC-ADD ON

## 2015-10-28 LAB — PREGNANCY, URINE: Preg Test, Ur: NEGATIVE

## 2015-10-28 LAB — RAPID STREP SCREEN (MED CTR MEBANE ONLY): Streptococcus, Group A Screen (Direct): NEGATIVE

## 2015-10-28 MED ORDER — ONDANSETRON 8 MG PO TBDP
8.0000 mg | ORAL_TABLET | Freq: Once | ORAL | Status: AC
  Administered 2015-10-28: 8 mg via ORAL
  Filled 2015-10-28: qty 1

## 2015-10-28 NOTE — ED Notes (Signed)
Pt eloped.

## 2015-10-28 NOTE — ED Provider Notes (Signed)
CSN: [id number]     Arrival date & time 10/28/15  0031 History  By signing my name below, I, Bethel Born, attest that this documentation has been prepared under the direction and in the presence of Quavion Boule, MD. Electronically Signed: Bethel Born, ED Scribe. 10/28/2015. 1:03 AM   Chief Complaint  Patient presents with  . Emesis   Patient is a 45 y.o. female presenting with vomiting. The history is provided by the patient. No language interpreter was used.  Emesis Severity:  Mild Duration:  1 day Timing:  Constant Quality:  Stomach contents Progression:  Unchanged Chronicity:  New Recent urination:  Normal Relieved by:  Nothing Worsened by:  Nothing tried Ineffective treatments:  None tried Associated symptoms: cough, fever and headaches   Associated symptoms: no diarrhea   Risk factors: no sick contacts    Courtney Landry is a 45 y.o. female who presents to the Emergency Department complaining of nausea and vomiting with onset within the last 24 hours. Associated symptoms include headache and sore throat for 1 week. She states that she was treated for pneumonia last week and while her symptoms had resolved her cough seems to be returning. Pt denies nasal congestion and SOB.  No known sick contact.   Past Medical History  Diagnosis Date  . Kidney stones   . UTI (urinary tract infection)   . Migraine   . Palpitation    Past Surgical History  Procedure Laterality Date  . Cholecystectomy    . Cesarean section    . Tubal ligation    . Appendectomy     History reviewed. No pertinent family history. Social History  Substance Use Topics  . Smoking status: Current Every Day Smoker -- 1.00 packs/day    Types: Cigarettes  . Smokeless tobacco: Never Used  . Alcohol Use: No   OB History    No data available     Review of Systems  Constitutional: Negative for fever.  HENT: Negative for congestion.   Respiratory: Negative for shortness of breath.    Gastrointestinal: Positive for nausea and vomiting. Negative for diarrhea.  Neurological: Positive for headaches.  All other systems reviewed and are negative.   Allergies  Review of patient's allergies indicates no known allergies.  Home Medications   Prior to Admission medications   Medication Sig Start Date End Date Taking? Authorizing Provider  atenolol (TENORMIN) 50 MG tablet Take 50 mg by mouth 2 (two) times daily.    Historical Provider, MD  DULoxetine HCl (CYMBALTA PO) Take by mouth.    Historical Provider, MD  ibuprofen (ADVIL,MOTRIN) 800 MG tablet Take 800 mg by mouth every 8 (eight) hours as needed. Patient used this medication for pain.    Historical Provider, MD  oxymetazoline (AFRIN) 0.05 % nasal spray Place 1 spray into both nostrils 2 (two) times daily.    Historical Provider, MD   BP 123/58 mmHg  Pulse 65  Temp(Src) 98.1 F (36.7 C)  Resp 18  Ht  (1.6 m)  Wt 195 lb (88.451 kg)  BMI 34.55 kg/m2  SpO2 98%  LMP 10/14/2015 Physical Exam  Constitutional: She is oriented to person, place, and time. She appears well-developed and well-nourished. No distress.  Well appearing  HENT:  Head: Normocephalic and atraumatic.  Mouth/Throat: Oropharynx is clear and moist. No oropharyngeal exudate.  Moist mucous membranes No exudate  Eyes: Pupils are equal, round, and reactive to light.  Neck: Normal range of motion. Neck supple.  Trachea midline No bruit  Cardiovascular: Normal rate and regular rhythm.   Pulmonary/Chest: Effort normal and breath sounds normal. No stridor. She has no wheezes. She has no rales.  CTAB  Abdominal: Soft. She exhibits no mass. Bowel sounds are increased. There is no tenderness. There is no rebound and no guarding.  Musculoskeletal: Normal range of motion.  Lymphadenopathy:    She has no cervical adenopathy.  Neurological: She is alert and oriented to person, place, and time.  Lower DTRs intact  Skin: Skin is warm and dry. She is not  diaphoretic.  Psychiatric: She has a normal mood and affect. Her behavior is normal.  Nursing note and vitals reviewed.   ED Course  Procedures (including critical care time) DIAGNOSTIC STUDIES: Oxygen Saturation is 98% on RA,  normal by my interpretation.    COORDINATION OF CARE: 12:46 AM Discussed treatment plan which includes lab work and Zofran with pt at bedside and pt agreed to plan.  Labs Review Labs Reviewed  PREGNANCY, URINE  URINALYSIS, ROUTINE W REFLEX MICROSCOPIC (NOT AT [facility])    Imaging Review No results found. I have personally reviewed and evaluated these lab results as part of my medical decision-making.   EKG Interpretation None      MDM   Final diagnoses:  None    She has recently had multiple rounds of antibiotics for unknown reasons.  Patient does not have pneumonia based on exam, nor vitals nor previous chest xrays.  Patient is being treated symptomatically.     Patient eloped  I personally performed the services described in this documentation, which was scribed in my presence. The recorded information has been reviewed and is accurate.      Cy Blamer, MD 10/28/15 [phone]

## 2015-10-28 NOTE — ED Notes (Signed)
Vomiting that started today.  Reports HA after vomiting.

## 2015-10-30 LAB — CULTURE, GROUP A STREP (THRC)

## 2016-01-21 ENCOUNTER — Encounter (HOSPITAL_BASED_OUTPATIENT_CLINIC_OR_DEPARTMENT_OTHER): Payer: Self-pay

## 2016-01-21 ENCOUNTER — Emergency Department (HOSPITAL_BASED_OUTPATIENT_CLINIC_OR_DEPARTMENT_OTHER)

## 2016-01-21 ENCOUNTER — Emergency Department (HOSPITAL_BASED_OUTPATIENT_CLINIC_OR_DEPARTMENT_OTHER)
Admission: EM | Admit: 2016-01-21 | Discharge: 2016-01-22 | Disposition: A | Discharge status: Home / Self Care | Attending: Emergency Medicine | Admitting: Emergency Medicine

## 2016-01-21 DIAGNOSIS — F17210 Nicotine dependence, cigarettes, uncomplicated: Secondary | ICD-10-CM | POA: Diagnosis not present

## 2016-01-21 DIAGNOSIS — R079 Chest pain, unspecified: Secondary | ICD-10-CM | POA: Diagnosis present

## 2016-01-21 DIAGNOSIS — R000 Tachycardia, unspecified: Secondary | ICD-10-CM

## 2016-01-21 LAB — COMPREHENSIVE METABOLIC PANEL
ALT: 17 U/L (ref 14–54)
AST: 18 U/L (ref 15–41)
Albumin: 3.9 g/dL (ref 3.5–5.0)
Alkaline Phosphatase: 82 U/L (ref 38–126)
Anion gap: 9 (ref 5–15)
BUN: 13 mg/dL (ref 6–20)
CO2: 25 mmol/L (ref 22–32)
Calcium: 9.2 mg/dL (ref 8.9–10.3)
Chloride: 105 mmol/L (ref 101–111)
Creatinine, Ser: 1.12 mg/dL — ABNORMAL HIGH (ref 0.44–1.00)
GFR calc Af Amer: >60 mL/min (ref >60)
GFR calc non Af Amer: 59 mL/min — ABNORMAL LOW (ref >60)
Glucose, Bld: 95 mg/dL (ref 65–99)
Potassium: 3.8 mmol/L (ref 3.5–5.1)
Sodium: 139 mmol/L (ref 135–145)
Total Bilirubin: 0.5 mg/dL (ref 0.3–1.2)
Total Protein: 7.5 g/dL (ref 6.5–8.1)

## 2016-01-21 LAB — CBC
HCT: 36.4 % (ref 36.0–46.0)
Hemoglobin: 11.9 g/dL — ABNORMAL LOW (ref 12.0–15.0)
MCH: 25.4 pg — ABNORMAL LOW (ref 26.0–34.0)
MCHC: 32.7 g/dL (ref 30.0–36.0)
MCV: 77.6 fL — ABNORMAL LOW (ref 78.0–100.0)
Platelets: 471 10*3/uL — ABNORMAL HIGH (ref 150–400)
RBC: 4.69 MIL/uL (ref 3.87–5.11)
RDW: 18.5 % — ABNORMAL HIGH (ref 11.5–15.5)
WBC: 15.4 10*3/uL — ABNORMAL HIGH (ref 4.0–10.5)

## 2016-01-21 LAB — TROPONIN I: Troponin I: <0.03 ng/mL (ref <0.031)

## 2016-01-21 LAB — PREGNANCY, URINE: Preg Test, Ur: NEGATIVE

## 2016-01-21 LAB — D-DIMER, QUANTITATIVE: D-Dimer, Quant: 0.82 ug/mL-FEU — ABNORMAL HIGH (ref 0.00–0.50)

## 2016-01-21 MED ORDER — GI COCKTAIL ~~LOC~~
30.0000 mL | Freq: Once | ORAL | Status: AC
  Administered 2016-01-21: 30 mL via ORAL
  Filled 2016-01-21: qty 30

## 2016-01-21 MED ORDER — SODIUM CHLORIDE 0.9 % IV BOLUS (SEPSIS)
1000.0000 mL | Freq: Once | INTRAVENOUS | Status: AC
  Administered 2016-01-21: 1000 mL via INTRAVENOUS

## 2016-01-21 NOTE — ED Provider Notes (Signed)
CSN: [id number]     Arrival date & time 01/21/16  2008 History  By signing my name below, I, Freida Busman, attest that this documentation has been prepared under the direction and in the presence of Zaron Zwiefelhofer, MD . Electronically Signed: Freida Busman, Scribe. 01/22/2016 12:02 AM.    Chief Complaint  Patient presents with  . Chest Pain   Patient is a 45 y.o. female presenting with palpitations. The history is provided by the patient. No language interpreter was used.  Palpitations Onset quality:  Sudden Timing:  Intermittent Chronicity:  Recurrent Context: not appetite suppressants, not caffeine, not exercise and not stimulant use   Worsened by:  Nothing Associated symptoms: chest pain   Associated symptoms: no cough and no shortness of breath    HPI Comments:  Sherol Sabas is a 45 y.o. female who presents to the Emergency Department complaining of CP and palpitations onset ~ 1900 today. She was seated at time of onset. Pt states her HR fluctuated between 131-35 bpm. Pt reports h/o same and  is currently on atenolol and is complaint with dose.   She denies recent changes to diet, or lifestyle; no increase in caffeine intake, use of supplements or new work-out regimens. She also denies BCP use, recent swelling to her BLE, and recent travel, cough and congestion. No alleviating factors noted. Her ast stress test was Nov 2016.   Past Medical History  Diagnosis Date  . Kidney stones   . UTI (urinary tract infection)   . Migraine   . Palpitation    Past Surgical History  Procedure Laterality Date  . Cholecystectomy    . Cesarean section    . Tubal ligation    . Appendectomy     No family history on file. Social History  Substance Use Topics  . Smoking status: Current Every Day Smoker -- 1.00 packs/day    Types: Cigarettes  . Smokeless tobacco: Never Used  . Alcohol Use: No   OB History    No data available     Review of Systems  HENT: Negative for congestion.    Respiratory: Negative for cough and shortness of breath.   Cardiovascular: Positive for chest pain and palpitations. Negative for leg swelling.  Neurological: Negative for syncope.  All other systems reviewed and are negative.  Allergies  Review of patient's allergies indicates no known allergies.  Home Medications   Prior to Admission medications   Medication Sig Start Date End Date Taking? Authorizing Provider  atenolol (TENORMIN) 50 MG tablet Take 50 mg by mouth 2 (two) times daily.    Historical Provider, MD  DULoxetine HCl (CYMBALTA PO) Take by mouth.    Historical Provider, MD  ibuprofen (ADVIL,MOTRIN) 800 MG tablet Take 800 mg by mouth every 8 (eight) hours as needed. Patient used this medication for pain.    Historical Provider, MD   BP 107/72 mmHg  Pulse 77  Temp(Src) 98.7 F (37.1 C) (Oral)  Resp 21  Ht  (1.6 m)  Wt 200 lb (90.719 kg)  BMI 35.44 kg/m2  SpO2 97%  LMP 01/21/2016 Physical Exam  Constitutional: She is oriented to person, place, and time. She appears well-developed and well-nourished. No distress.  HENT:  Head: Normocephalic and atraumatic.  Mouth/Throat: Oropharynx is clear and moist. No oropharyngeal exudate.  Moist mucous membranes   Eyes: Conjunctivae are normal. Pupils are equal, round, and reactive to light.  Neck: Normal range of motion. Neck supple. No JVD present.  Trachea midline No  bruits  Cardiovascular: Normal rate, regular rhythm and normal heart sounds.   Pulmonary/Chest: Effort normal and breath sounds normal. No stridor. No respiratory distress. She has no wheezes. She has no rales.  Abdominal: Soft. Bowel sounds are normal. She exhibits no distension.  Musculoskeletal: Normal range of motion. She exhibits no edema or tenderness.  No cords No edema   Neurological: She is alert and oriented to person, place, and time. She has normal reflexes.  Skin: Skin is warm and dry. She is not diaphoretic.  Psychiatric: She has a normal  mood and affect. Her behavior is normal.  Nursing note and vitals reviewed.   ED Course  Procedures   DIAGNOSTIC STUDIES:  Oxygen Saturation is 98% on RA, normal by my interpretation.    COORDINATION OF CARE:  11:18 PM Discussed treatment plan with pt at bedside and pt agreed to plan.  Labs Review Labs Reviewed  CBC - Abnormal; Notable for the following:    WBC 15.4 (*)    Hemoglobin 11.9 (*)    MCV 77.6 (*)    MCH 25.4 (*)    RDW 18.5 (*)    Platelets 471 (*)    All other components within normal limits  COMPREHENSIVE METABOLIC PANEL - Abnormal; Notable for the following:    Creatinine, Ser 1.12 (*)    GFR calc non Af Amer 59 (*)    All other components within normal limits  D-DIMER, QUANTITATIVE (NOT AT [facility]) - Abnormal; Notable for the following:    D-Dimer, Quant 0.82 (*)    All other components within normal limits  TROPONIN I  PREGNANCY, URINE  TROPONIN I    Imaging Review Dg Chest 2 View  01/21/2016  CLINICAL DATA:  Chest pain and left shoulder pain for the past 3 hours. No known trauma. EXAM: CHEST  2 VIEW COMPARISON:  09/28/2015; 08/28/2015 FINDINGS: Grossly unchanged cardiac silhouette and mediastinal contours. A lead overlies the right upper/mid lung. No focal parenchymal opacities. No pleural effusion or pneumothorax. No evidence of edema. No acute osseous abnormalities. Post cholecystectomy. IMPRESSION: No acute cardiopulmonary disease. Specifically, no evidence of pneumonia. Electronically Signed   By: Simonne Come M.D.   On: 01/21/2016 22:08   I have personally reviewed and evaluated these images and lab results as part of my medical decision-making.   EKG Interpretation   Date/Time:  Tuesday Jan 21 2016 20:16:32 EDT Ventricular Rate:  101 PR Interval:  132 QRS Duration: 78 QT Interval:  332 QTC Calculation: 430 R Axis:   37 Text Interpretation:  Sinus tachycardia Possible Anterior infarct , age  undetermined Abnormal ECG No significant change since  last tracing  Confirmed by Ethelda Chick  MD, SAM [phone]) on 01/21/2016 8:19:27 PM      MDM   Final diagnoses:  None    Filed Vitals:   01/21/16 2300 01/22/16 0000  BP: 102/68 116/63  Pulse: 73 68  Temp:    Resp: 13 22   Results for orders placed or performed during the hospital encounter of 01/21/16  CBC  Result Value Ref Range   WBC 15.4 (H) 4.0 - 10.5 K/uL   RBC 4.69 3.87 - 5.11 MIL/uL   Hemoglobin 11.9 (L) 12.0 - 15.0 g/dL   HCT 60.4 54.0 - 98.1 %   MCV 77.6 (L) 78.0 - 100.0 fL   MCH 25.4 (L) 26.0 - 34.0 pg   MCHC 32.7 30.0 - 36.0 g/dL   RDW 19.1 (H) 47.8 - 29.5 %   Platelets 471 (  H) 150 - 400 K/uL  Troponin I  Result Value Ref Range   Troponin I <0.03 <0.031 ng/mL  Pregnancy, urine  Result Value Ref Range   Preg Test, Ur NEGATIVE NEGATIVE  Comprehensive metabolic panel  Result Value Ref Range   Sodium 139 135 - 145 mmol/L   Potassium 3.8 3.5 - 5.1 mmol/L   Chloride 105 101 - 111 mmol/L   CO2 25 22 - 32 mmol/L   Glucose, Bld 95 65 - 99 mg/dL   BUN 13 6 - 20 mg/dL   Creatinine, Ser 1.61 (H) 0.44 - 1.00 mg/dL   Calcium 9.2 8.9 - 09.6 mg/dL   Total Protein 7.5 6.5 - 8.1 g/dL   Albumin 3.9 3.5 - 5.0 g/dL   AST 18 15 - 41 U/L   ALT 17 14 - 54 U/L   Alkaline Phosphatase 82 38 - 126 U/L   Total Bilirubin 0.5 0.3 - 1.2 mg/dL   GFR calc non Af Amer 59 (L) >60 mL/min   GFR calc Af Amer >60 >60 mL/min   Anion gap 9 5 - 15  Troponin I  Result Value Ref Range   Troponin I <0.03 <0.031 ng/mL  D-dimer, quantitative (not at [facility])  Result Value Ref Range   D-Dimer, Quant 0.82 (H) 0.00 - 0.50 ug/mL-FEU   Dg Chest 2 View  01/21/2016  CLINICAL DATA:  Chest pain and left shoulder pain for the past 3 hours. No known trauma. EXAM: CHEST  2 VIEW COMPARISON:  09/28/2015; 08/28/2015 FINDINGS: Grossly unchanged cardiac silhouette and mediastinal contours. A lead overlies the right upper/mid lung. No focal parenchymal opacities. No pleural effusion or pneumothorax. No evidence of  edema. No acute osseous abnormalities. Post cholecystectomy. IMPRESSION: No acute cardiopulmonary disease. Specifically, no evidence of pneumonia. Electronically Signed   By: Simonne Come M.D.   On: 01/21/2016 22:08   Ct Angio Chest Pe W/cm &/or Wo Cm  01/22/2016  CLINICAL DATA:  Palpitations and left shoulder pain for 6 hours. EXAM: CT ANGIOGRAPHY CHEST WITH CONTRAST TECHNIQUE: Multidetector CT imaging of the chest was performed using the standard protocol during bolus administration of intravenous contrast. Multiplanar CT image reconstructions and MIPs were obtained to evaluate the vascular anatomy. CONTRAST:  80 mL Isovue 370 intravenous COMPARISON:  Radiographs 01/21/2016 FINDINGS: Cardiovascular: There is good opacification of the pulmonary arteries. There is no pulmonary embolism. The thoracic aorta is normal in caliber and intact. Lungs: Minimal nodularity on the major fissure in the lateral aspect of the left lung, axial image 41 series 6 and sagittal image 25 series 10. Maximum 4 mm. This is indeterminate but much more likely benign. Minimal noncalcified nodularity in the left base anteriorly, axial image 60 series 6, matter 5 mm. No evidence of pneumonia. No suspicious nodules. Central airways: Normal Effusions: None Lymphadenopathy: None Esophagus: Unremarkable Upper abdomen: Small hiatal hernia Musculoskeletal: No significant abnormality Review of the MIP images confirms the above findings. IMPRESSION: Mild nodularity in the lungs, not conclusively characterized but more likely benign. No follow-up needed if patient is low-risk (and has no known or suspected primary neoplasm). Non-contrast chest CT can be considered in 12 months if patient is high-risk. This recommendation follows the consensus statement: Guidelines for Management of Incidental Pulmonary Nodules Detected on CT Images:From the Fleischner Society 2017; published online before print (10.1148/radiol.0454098119). Negative for acute pulmonary  embolism. Electronically Signed   By: Ellery Plunk M.D.   On: 01/22/2016 01:52   Medications  sodium chloride 0.9 % bolus  1,000 mL (0 mLs Intravenous Stopped 01/22/16 0138)  gi cocktail (Maalox,Lidocaine,Donnatal) (30 mLs Oral Given 01/21/16 2341)  iopamidol (ISOVUE-370) 76 % injection 80 mL (80 mLs Intravenous Contrast Given 01/22/16 0126)     Known tachycardia with confirmed recent normal stress test.  Following up with cardiology for further testing of tachycardia this am.  HEART score 1 ruled out for both MI and PE in the ED> will need a chest CT in 12 months to assess stability of nodules on chest CT.  This information was provided on discharge paperwork and informed patient and husband who verbalize understanding and agree to follow up   I personally performed the services described in this documentation, which was scribed in my presence. The recorded information has been reviewed and is accurate.     Cy BlamerApril Raciel Caffrey, MD 01/22/16 [phone]

## 2016-01-21 NOTE — ED Notes (Signed)
CP since 7pm

## 2016-01-22 ENCOUNTER — Encounter (HOSPITAL_BASED_OUTPATIENT_CLINIC_OR_DEPARTMENT_OTHER): Payer: Self-pay | Admitting: Emergency Medicine

## 2016-01-22 ENCOUNTER — Emergency Department (HOSPITAL_BASED_OUTPATIENT_CLINIC_OR_DEPARTMENT_OTHER)

## 2016-01-22 LAB — TROPONIN I: Troponin I: <0.03 ng/mL (ref <0.031)

## 2016-01-22 MED ORDER — IOPAMIDOL (ISOVUE-370) INJECTION 76%
80.0000 mL | Freq: Once | INTRAVENOUS | Status: AC | PRN
  Administered 2016-01-22: 80 mL via INTRAVENOUS

## 2016-01-22 NOTE — ED Notes (Signed)
Pt verbalizes understanding of d/c instructions and denies any further needs at this time. 

## 2016-01-22 NOTE — Discharge Instructions (Signed)
Electrophysiology Study An electrophysiology (EP) study is an invasive heart test done through catheters placed in a large vein in your groin, arm, neck, or chest. It is done to evaluate the electrical conduction system of your heart. An EP study is done if other heart tests have not found or fully explained the cause of symptoms such as:  Dizziness or fainting.  A fast heartbeat (tachycardia).  A slow heartbeat (bradycardia). If the cause of your symptoms is found during the EP study, your health care provider will discuss treatment options. Some treatment options that may be done to correct your symptoms include:  Cardiac ablation. Cardiac ablation destroys a small area of heart tissue that may be causing tachycardia.  Implantable cardioverter defibrillator (ICD). An ICD can detect a fast or abnormal heart rate. When an abnormal rhythm is detected, the ICD shocks the heart to restore it to a normal heart rhythm. LET [facility] CARE PROVIDER KNOW ABOUT:   Any allergies you have.  All medicines you are taking, including vitamins, herbs, eye drops, creams, and over-the-counter medicines.  Previous problems you or members of your family have had with the use of anesthetics.  Any blood disorders you have.  Previous surgeries you have had.  Medical conditions you have. RISKS AND COMPLICATIONS  Generally, this is a safe procedure. However, as with any procedure, problems can occur. Possible problems include:  Tachycardia that does not go away. This may require shocking your heart (cardioversion).  Bleeding or bruising from the catheter insertion sites.  Infection at the catheter insertion sites.  Temporary or permanent heart rhythm abnormalities.  Temporary changes in blood pressure.  Puncture (perforation) of the heart wall. This can cause bleeding between the heart and the sac that surrounds it (cardiac tamponade). This is a life-threatening condition that may require heart  surgery.  Possible cardiac arrest or fatal heart arrhythmia. BEFORE THE PROCEDURE  Do not eat or drink before the EP study as instructed by your health care provider.  Be sure to urinate before the EP study.  If you are going home after the EP study, someone will need to drive you home and stay with you for 24 hours. PROCEDURE The EP study is performed in a catheterization laboratory. This is a room set up to do heart procedures.   You will be given medicine through an intravenous (IV) access to reduce discomfort and help you relax.  The area where the catheter will be inserted will be shaved as needed and cleansed. Sterile drapes will cover you. This will keep the area sterile.  Thin, flexible tubes (catheters) with an electrode tip will be inserted into a large vein. From there, the catheters are guided to the heart using a type of X-ray machine (fluoroscopy). Once in the heart, the catheters evaluate the electrical activity of your heart.  If you are awake during the EP study, you may feel dizzy or light-headed. Your heart rate may temporarily increase or you may feel your heart beating hard. Tell your health care provider if you feel dizzy, nauseated, or have chest pain or pressure during the EP study.  When the EP study is done, the catheters are removed.  Firm pressure is applied to the insertion sites. This is done to prevent bleeding. AFTER THE PROCEDURE  You will need to lie flat for a few hours or as told by your health care provider. You will need to keep your legs straight. Do not bend or cross your legs. This is  done so the clot at the insertion does not break loose and cause bleeding.  If you took blood thinners before the EP study, ask your health care provider when you can start taking them again.   This information is not intended to replace advice given to you by your health care provider. Make sure you discuss any questions you have with your health care provider.     Document Released: 02/25/2010 Document Revised: 09/12/2013 Document Reviewed: 03/22/2013 Elsevier Interactive Patient Education [employer]2016 [employer]

## 2016-12-08 ENCOUNTER — Encounter (HOSPITAL_BASED_OUTPATIENT_CLINIC_OR_DEPARTMENT_OTHER): Payer: Self-pay | Admitting: Emergency Medicine

## 2016-12-08 ENCOUNTER — Emergency Department (HOSPITAL_BASED_OUTPATIENT_CLINIC_OR_DEPARTMENT_OTHER)
Admission: EM | Admit: 2016-12-08 | Discharge: 2016-12-08 | Disposition: A | Discharge status: Home / Self Care | Attending: Emergency Medicine | Admitting: Emergency Medicine

## 2016-12-08 DIAGNOSIS — R51 Headache: Secondary | ICD-10-CM | POA: Insufficient documentation

## 2016-12-08 DIAGNOSIS — R112 Nausea with vomiting, unspecified: Secondary | ICD-10-CM | POA: Insufficient documentation

## 2016-12-08 DIAGNOSIS — R42 Dizziness and giddiness: Secondary | ICD-10-CM | POA: Insufficient documentation

## 2016-12-08 DIAGNOSIS — F17210 Nicotine dependence, cigarettes, uncomplicated: Secondary | ICD-10-CM | POA: Diagnosis not present

## 2016-12-08 DIAGNOSIS — R519 Headache, unspecified: Secondary | ICD-10-CM

## 2016-12-08 HISTORY — DX: Major depressive disorder, single episode, unspecified: F32.9

## 2016-12-08 MED ORDER — DIPHENHYDRAMINE HCL 50 MG/ML IJ SOLN
25.0000 mg | Freq: Once | INTRAMUSCULAR | Status: AC
  Administered 2016-12-08: 25 mg via INTRAVENOUS
  Filled 2016-12-08: qty 1

## 2016-12-08 MED ORDER — BUTALBITAL-APAP-CAFFEINE 50-325-40 MG PO TABS: 1.0000 | ORAL_TABLET | Freq: Four times a day (QID) | ORAL | 0 refills | Status: AC | PRN

## 2016-12-08 MED ORDER — METOCLOPRAMIDE HCL 5 MG/ML IJ SOLN
10.0000 mg | Freq: Once | INTRAMUSCULAR | Status: AC
  Administered 2016-12-08: 10 mg via INTRAVENOUS
  Filled 2016-12-08: qty 2

## 2016-12-08 MED ORDER — SODIUM CHLORIDE 0.9 % IV BOLUS (SEPSIS)
1000.0000 mL | Freq: Once | INTRAVENOUS | Status: AC
  Administered 2016-12-08: 1000 mL via INTRAVENOUS

## 2016-12-08 NOTE — ED Provider Notes (Signed)
[facility] Provider Note   CSN: [id number] Arrival date & time: 12/08/16  2126  By signing my name below, I, Linna Darner, attest that this documentation has been prepared under the direction and in the presence of [employer], PA-C. Electronically Signed: Linna Darner, Scribe. 12/08/2016. 10:36 PM.  History   Chief Complaint Chief Complaint  Patient presents with  . Migraine    The history is provided by the patient. No language interpreter was used.     HPI Comments: Courtney Landry is a 46 y.o. female with PMHx including migraines who presents to the Emergency Department complaining of a constant, gradually worsening frontal headache beginning two days ago. She reports associated nausea, vomiting, and dizziness. Pt has tried ibuprofen 800mg  with transient relief of her headache. She states her current headache is consistent with past migraines. Pt notes she has meclizine at home which she uses for her chronic migraines when ibuprofen does not provide any relief; she has not taken meclizine for her current headache. She is able to ambulate. She denies neck pain, numbness/tingling, focal weakness, fevers, chills, or any other associated symptoms.  Past Medical History:  Diagnosis Date  . Kidney stones   . Major depressive disorder   . Migraine   . Palpitation   . UTI (urinary tract infection)     There are no active problems to display for this patient.   Past Surgical History:  Procedure Laterality Date  . APPENDECTOMY    . CESAREAN SECTION    . CHOLECYSTECTOMY    . TUBAL LIGATION      OB History    No data available       Home Medications    Prior to Admission medications   Medication Sig Start Date End Date Taking? Authorizing Provider  atenolol (TENORMIN) 50 MG tablet Take 50 mg by mouth 2 (two) times daily.    Historical Provider, MD  DULoxetine HCl (CYMBALTA PO) Take by mouth.    Historical Provider, MD  ibuprofen (ADVIL,MOTRIN) 800 MG tablet  Take 800 mg by mouth every 8 (eight) hours as needed. Patient used this medication for pain.    Historical Provider, MD    Family History No family history on file.  Social History Social History  Substance Use Topics  . Smoking status: Current Every Day Smoker    Packs/day: 1.00    Types: Cigarettes  . Smokeless tobacco: Never Used  . Alcohol use No     Allergies   Patient has no known allergies.   Review of Systems Review of Systems  Constitutional: Negative for chills and fever.  HENT: Negative for congestion, dental problem, rhinorrhea and sinus pressure.   Eyes: Negative for photophobia, discharge, redness and visual disturbance.  Respiratory: Negative for shortness of breath.   Cardiovascular: Negative for chest pain.  Gastrointestinal: Positive for nausea and vomiting.  Musculoskeletal: Negative for gait problem, neck pain and neck stiffness.  Skin: Negative for rash.  Neurological: Positive for dizziness and headaches. Negative for syncope, speech difficulty, weakness, light-headedness and numbness.  Psychiatric/Behavioral: Negative for confusion.    Physical Exam Updated Vital Signs BP (!) 162/96 (BP Location: Left Arm)   Pulse 96   Temp 98.2 F (36.8 C) (Oral)   Resp 20   Ht 5\' 3"  (1.6 m)   Wt 204 lb (92.5 kg)   SpO2 100%   BMI 36.14 kg/m   Physical Exam  Constitutional: She is oriented to person, place, and time. She appears well-developed and well-nourished.  No distress.  HENT:  Head: Normocephalic and atraumatic.  Right Ear: Tympanic membrane, external ear and ear canal normal.  Left Ear: Tympanic membrane, external ear and ear canal normal.  Nose: Nose normal.  Mouth/Throat: Uvula is midline, oropharynx is clear and moist and mucous membranes are normal.  Eyes: Conjunctivae, EOM and lids are normal. Pupils are equal, round, and reactive to light. Right eye exhibits no nystagmus. Left eye exhibits no nystagmus.  No nystagmus elicited.  Neck:  Normal range of motion. Neck supple. No tracheal deviation present.  Cardiovascular: Normal rate and regular rhythm.   Pulmonary/Chest: Effort normal and breath sounds normal. No respiratory distress.  Abdominal: Soft. There is no tenderness.  Musculoskeletal: Normal range of motion.       Cervical back: She exhibits normal range of motion, no tenderness and no bony tenderness.  Neurological: She is alert and oriented to person, place, and time. She has normal strength and normal reflexes. No cranial nerve deficit or sensory deficit. She displays a negative Romberg sign. Coordination and gait normal. GCS eye subscore is 4. GCS verbal subscore is 5. GCS motor subscore is 6.  Skin: Skin is warm and dry.  Psychiatric: She has a normal mood and affect. Her behavior is normal.  Nursing note and vitals reviewed.   ED Treatments / Results   Procedures Procedures (including critical care time)  DIAGNOSTIC STUDIES: Oxygen Saturation is 100% on RA, normal by my interpretation.    COORDINATION OF CARE: 10:41 PM Discussed treatment plan with pt at bedside and pt agreed to plan.  Medications Ordered in ED Medications  sodium chloride 0.9 % bolus 1,000 mL (1,000 mLs Intravenous New Bag/Given 12/08/16 2227)  metoCLOPramide (REGLAN) injection 10 mg (10 mg Intravenous Given 12/08/16 2229)  diphenhydrAMINE (BENADRYL) injection 25 mg (25 mg Intravenous Given 12/08/16 2227)     Initial Impression / Assessment and Plan / ED Course  I have reviewed the triage vital signs and the nursing notes.  Pertinent labs & imaging results that were available during my care of the patient were reviewed by me and considered in my medical decision making (see chart for details).     Vital signs reviewed and are as follows: Vitals:   12/08/16 2131 12/08/16 2336  BP: (!) 162/96 114/82  Pulse: 96 78  Resp: 20 16  Temp: 98.2 F (36.8 C)     11:38 PM patient with resolution of symptoms with treatment here in  emergency department. She is ready for discharge to home.  Written the the patient a prescription for Fioricet, which she has taken in the past for headaches not improved with ibuprofen.  Patient counseled to return if they have weakness in their arms or legs, slurred speech, trouble walking or talking, confusion, trouble with their balance, or if they have any other concerns. Patient verbalizes understanding and agrees with plan.    Final Clinical Impressions(s) / ED Diagnoses   Final diagnoses:  Acute nonintractable headache, unspecified headache type   Patient without high-risk features of headache including: sudden onset/thunderclap HA, no similar headache in past, altered mental status, accompanying seizure, headache with exertion, age > 4350, history of immunocompromise, neck or shoulder pain, fever, use of anticoagulation, family history of spontaneous SAH, concomitant drug use, toxic exposure.   Patient has a normal complete neurological exam, normal vital signs, normal level of consciousness, no signs of meningismus, is well-appearing/non-toxic appearing, no signs of trauma.  Imaging with CT/MRI not indicated given history and physical exam findings.  No dangerous or life-threatening conditions suspected or identified by history, physical exam, and by work-up. No indications for hospitalization identified.    New Prescriptions New Prescriptions   BUTALBITAL-ACETAMINOPHEN-CAFFEINE (FIORICET, ESGIC) 50-325-40 MG TABLET    Take 1-2 tablets by mouth every 6 (six) hours as needed for headache.   I personally performed the services described in this documentation, which was scribed in my presence. The recorded information has been reviewed and is accurate.    Renne Crigler, PA-C 12/08/16 2339    Lavera Guise, MD 12/09/16 1224

## 2016-12-08 NOTE — ED Triage Notes (Signed)
Pt c/o migraine x 2 days.  ?

## 2016-12-08 NOTE — Discharge Instructions (Signed)
Please read and follow all provided instructions.  Your diagnoses today include:  1. Acute nonintractable headache, unspecified headache type     Tests performed today include:  Vital signs. See below for your results today.   Medications:  In the Emergency Department you received:  Reglan - antinausea/headache medication  Benadryl - antihistamine to counteract potential side effects of reglan  Take any prescribed medications only as directed.  Additional information:  Follow any educational materials contained in this packet.  You are having a headache. No specific cause was found today for your headache. It may have been a migraine or other cause of headache. Stress, anxiety, fatigue, and depression are common triggers for headaches.   Your headache today does not appear to be life-threatening or require hospitalization, but often the exact cause of headaches is not determined in the emergency department. Therefore, follow-up with your doctor is very important to find out what may have caused your headache and whether or not you need any further diagnostic testing or treatment.   Sometimes headaches can appear benign (not harmful), but then more serious symptoms can develop which should prompt an immediate re-evaluation by your doctor or the emergency department.  BE VERY CAREFUL not to take multiple medicines containing Tylenol (also called acetaminophen). Doing so can lead to an overdose which can damage your liver and cause liver failure and possibly death.   Follow-up instructions: Please follow-up with your primary care provider as needed for further evaluation of your symptoms.   Return instructions:   Please return to the Emergency Department if you experience worsening symptoms.  Return if the medications do not resolve your headache, if it recurs, or if you have multiple episodes of vomiting or cannot keep down fluids.  Return if you have a change from the usual  headache.  RETURN IMMEDIATELY IF you:  Develop a sudden, severe headache  Develop confusion or become poorly responsive or faint  Develop a fever above 100.93F or problem breathing  Have a change in speech, vision, swallowing, or understanding  Develop new weakness, numbness, tingling, incoordination in your arms or legs  Have a seizure  Please return if you have any other emergent concerns.  Additional Information:  Your vital signs today were: BP (!) 162/96 (BP Location: Left Arm)    Pulse 96    Temp 98.2 F (36.8 C) (Oral)    Resp 20    Ht 5\' 3"  (1.6 m)    Wt 92.5 kg    SpO2 100%    BMI 36.14 kg/m  If your blood pressure (BP) was elevated above 135/85 this visit, please have this repeated by your doctor within one month. --------------

## 2016-12-10 ENCOUNTER — Encounter (HOSPITAL_BASED_OUTPATIENT_CLINIC_OR_DEPARTMENT_OTHER): Payer: Self-pay | Admitting: *Deleted

## 2016-12-10 ENCOUNTER — Emergency Department (HOSPITAL_BASED_OUTPATIENT_CLINIC_OR_DEPARTMENT_OTHER)
Admission: EM | Admit: 2016-12-10 | Discharge: 2016-12-10 | Disposition: A | Discharge status: Home / Self Care | Attending: Emergency Medicine | Admitting: Emergency Medicine

## 2016-12-10 DIAGNOSIS — F17210 Nicotine dependence, cigarettes, uncomplicated: Secondary | ICD-10-CM | POA: Diagnosis not present

## 2016-12-10 DIAGNOSIS — R51 Headache: Secondary | ICD-10-CM | POA: Diagnosis present

## 2016-12-10 DIAGNOSIS — R197 Diarrhea, unspecified: Secondary | ICD-10-CM | POA: Diagnosis not present

## 2016-12-10 DIAGNOSIS — R519 Headache, unspecified: Secondary | ICD-10-CM

## 2016-12-10 MED ORDER — DIPHENHYDRAMINE HCL 50 MG/ML IJ SOLN
25.0000 mg | Freq: Once | INTRAMUSCULAR | Status: AC
  Administered 2016-12-10: 25 mg via INTRAVENOUS
  Filled 2016-12-10: qty 1

## 2016-12-10 MED ORDER — PROCHLORPERAZINE EDISYLATE 5 MG/ML IJ SOLN
10.0000 mg | Freq: Once | INTRAMUSCULAR | Status: AC
  Administered 2016-12-10: 10 mg via INTRAVENOUS
  Filled 2016-12-10: qty 2

## 2016-12-10 NOTE — ED Notes (Signed)
Patient claimed that she always have a migraine but this time she has been hurting since Sunday.

## 2016-12-10 NOTE — ED Triage Notes (Signed)
Headache. States she was taken off Effexor and has had headaches since.

## 2016-12-10 NOTE — ED Provider Notes (Signed)
[facility]   CSN: [id number] Arrival date & time: 12/10/16  1431   By signing my name below, I, Teofilo Pod, attest that this documentation has been prepared under the direction and in the presence of Felicie Morn, NP. Electronically Signed: Teofilo Pod, ED Scribe. 12/10/2016. 4:46 PM.   History   Chief Complaint Chief Complaint  Patient presents with  . Headache    The history is provided by the patient. No language interpreter was used.   HPI Comments:  Courtney Landry is a 46 y.o. female who presents to the Emergency Department complaining of a constant headache  Pt states that the headache is similar to migraines. Pt states that she was taken off of Effexor 3 days ago and has been having headaches since. Pt went from taking 250mg , to 150 to 70. She had been taking Effexor for 9 months. Pt complains of associated diarrhea. No alleviating factors noted. Pt denies other associated symptoms.   Past Medical History:  Diagnosis Date  . Kidney stones   . Major depressive disorder   . Migraine   . Palpitation   . UTI (urinary tract infection)     There are no active problems to display for this patient.   Past Surgical History:  Procedure Laterality Date  . APPENDECTOMY    . CESAREAN SECTION    . CHOLECYSTECTOMY    . TUBAL LIGATION      OB History    No data available       Home Medications    Prior to Admission medications   Medication Sig Start Date End Date Taking? Authorizing Provider  BuPROPion HCl (WELLBUTRIN PO) Take by mouth.   Yes Historical Provider, MD  ibuprofen (ADVIL,MOTRIN) 800 MG tablet Take 800 mg by mouth every 8 (eight) hours as needed. Patient used this medication for pain.   Yes Historical Provider, MD  atenolol (TENORMIN) 50 MG tablet Take 50 mg by mouth 2 (two) times daily.    Historical Provider, MD  butalbital-acetaminophen-caffeine (FIORICET, ESGIC) [phone] MG tablet Take 1-2 tablets by mouth every 6  (six) hours as needed for headache. 12/08/16 12/08/17  Renne Crigler, PA-C  DULoxetine HCl (CYMBALTA PO) Take by mouth.    Historical Provider, MD    Family History No family history on file.  Social History Social History  Substance Use Topics  . Smoking status: Current Every Day Smoker    Packs/day: 1.00    Types: Cigarettes  . Smokeless tobacco: Never Used  . Alcohol use No     Allergies   Patient has no known allergies.   Review of Systems Review of Systems  Gastrointestinal: Positive for diarrhea.  Neurological: Positive for headaches.  All other systems reviewed and are negative.    Physical Exam Updated Vital Signs BP (!) 142/90   Pulse 94   Temp 98.4 F (36.9 C) (Oral)   Resp 20   Ht 5\' 3"  (1.6 m)   Wt 204 lb (92.5 kg)   SpO2 99%   BMI 36.14 kg/m   Physical Exam  Constitutional: She appears well-developed and well-nourished. No distress.  HENT:  Head: Normocephalic and atraumatic.  Eyes: Conjunctivae are normal.  Cardiovascular: Normal rate.   Pulmonary/Chest: Effort normal.  Abdominal: She exhibits no distension.  Neurological: She is alert.  Skin: Skin is warm and dry.  Psychiatric: She has a normal mood and affect.  Nursing note and vitals reviewed.    ED Treatments / Results  DIAGNOSTIC  STUDIES:  Oxygen Saturation is 99% on RA, normal by my interpretation.    COORDINATION OF CARE:  4:46 PM Discussed treatment plan with pt at bedside and pt agreed to plan.   Labs (all labs ordered are listed, but only abnormal results are displayed) Labs Reviewed - No data to display  EKG  EKG Interpretation None       Radiology No results found.  Procedures Procedures (including critical care time)  Medications Ordered in ED Medications  diphenhydrAMINE (BENADRYL) injection 25 mg (25 mg Intravenous Given 12/10/16 1719)  prochlorperazine (COMPAZINE) injection 10 mg (10 mg Intravenous Given 12/10/16 1719)     Initial Impression /  Assessment and Plan / ED Course  I have reviewed the triage vital signs and the nursing notes.  Pertinent labs & imaging results that were available during my care of the patient were reviewed by me and considered in my medical decision making (see chart for details).     Pt HA treated and improved while in ED.  Presentation is like pts typical HA and non concerning for [facility], [facility]CH, Meningitis, or temporal arteritis. Pt is afebrile with no focal neuro deficits, nuchal rigidity, or change in vision. Patient with recent cessation of effexor, which may have triggered headache. Pt is to follow up with PCP/psychiatrist.  Pt verbalizes understanding and is agreeable with plan to dc. Return precautions discussed.  Final Clinical Impressions(s) / ED Diagnoses   Final diagnoses:  Bad headache    New Prescriptions Discharge Medication List as of 12/10/2016  6:04 PM     I personally performed the services described in this documentation, which was scribed in my presence. The recorded information has been reviewed and is accurate.      Felicie Mornavid Briony Parveen, NP 12/11/16 [id number]    Arby BarretteMarcy Pfeiffer, MD 12/19/16 [phone]

## 2017-01-10 ENCOUNTER — Emergency Department (HOSPITAL_BASED_OUTPATIENT_CLINIC_OR_DEPARTMENT_OTHER)
Admission: EM | Admit: 2017-01-10 | Discharge: 2017-01-10 | Disposition: A | Discharge status: Home / Self Care | Attending: Emergency Medicine | Admitting: Emergency Medicine

## 2017-01-10 ENCOUNTER — Emergency Department (HOSPITAL_BASED_OUTPATIENT_CLINIC_OR_DEPARTMENT_OTHER)

## 2017-01-10 ENCOUNTER — Encounter (HOSPITAL_BASED_OUTPATIENT_CLINIC_OR_DEPARTMENT_OTHER): Payer: Self-pay | Admitting: Emergency Medicine

## 2017-01-10 DIAGNOSIS — N201 Calculus of ureter: Secondary | ICD-10-CM | POA: Insufficient documentation

## 2017-01-10 DIAGNOSIS — F17210 Nicotine dependence, cigarettes, uncomplicated: Secondary | ICD-10-CM | POA: Insufficient documentation

## 2017-01-10 DIAGNOSIS — Z79899 Other long term (current) drug therapy: Secondary | ICD-10-CM | POA: Insufficient documentation

## 2017-01-10 LAB — URINALYSIS, ROUTINE W REFLEX MICROSCOPIC
Bilirubin Urine: NEGATIVE
Glucose, UA: NEGATIVE mg/dL
Ketones, ur: NEGATIVE mg/dL
Nitrite: POSITIVE — AB
Protein, ur: NEGATIVE mg/dL
Specific Gravity, Urine: 1.006 (ref 1.005–1.030)
pH: 6 (ref 5.0–8.0)

## 2017-01-10 LAB — PREGNANCY, URINE: Preg Test, Ur: NEGATIVE

## 2017-01-10 LAB — URINALYSIS, MICROSCOPIC (REFLEX)

## 2017-01-10 MED ORDER — HYDROMORPHONE HCL 1 MG/ML IJ SOLN
1.0000 mg | Freq: Once | INTRAMUSCULAR | Status: AC
  Administered 2017-01-10: 1 mg via INTRAVENOUS
  Filled 2017-01-10: qty 1

## 2017-01-10 MED ORDER — ONDANSETRON 8 MG PO TBDP: 8.0000 mg | ORAL_TABLET | Freq: Three times a day (TID) | ORAL | 0 refills | Status: DC | PRN

## 2017-01-10 MED ORDER — TAMSULOSIN HCL 0.4 MG PO CAPS
0.4000 mg | ORAL_CAPSULE | Freq: Once | ORAL | Status: AC
Start: 2017-01-10 — End: 2017-01-10
  Administered 2017-01-10: 0.4 mg via ORAL
  Filled 2017-01-10: qty 1

## 2017-01-10 MED ORDER — HYDROMORPHONE HCL 4 MG PO TABS: 4.0000 mg | ORAL_TABLET | ORAL | 0 refills | Status: AC | PRN

## 2017-01-10 MED ORDER — ONDANSETRON HCL 4 MG/2ML IJ SOLN
4.0000 mg | Freq: Once | INTRAMUSCULAR | Status: AC
  Administered 2017-01-10: 4 mg via INTRAVENOUS
  Filled 2017-01-10: qty 2

## 2017-01-10 MED ORDER — TAMSULOSIN HCL 0.4 MG PO CAPS: ORAL_CAPSULE | ORAL | 0 refills | Status: DC

## 2017-01-10 MED ORDER — SODIUM CHLORIDE 0.9 % IV SOLN
Freq: Once | INTRAVENOUS | Status: AC
  Administered 2017-01-10: 06:00:00 via INTRAVENOUS

## 2017-01-10 NOTE — ED Provider Notes (Signed)
Care assumed from Dr. Read Drivers at shift change. Patient found to have a large kidney stone in the distal UVJ. She has received 2 doses of pain medication and pain is significantly improved. She is not resting comfortably. She has an established urologist here at [facility] who I have advised her to call tomorrow to arrange a follow-up appointment as passage of the stone spontaneously is unlikely. Return precautions given.   Geoffery Lyons, MD 01/10/17 [phone]

## 2017-01-10 NOTE — ED Notes (Signed)
Patient transported to CT 

## 2017-01-10 NOTE — ED Notes (Signed)
Given urine strainer and instructions for use

## 2017-01-10 NOTE — ED Provider Notes (Signed)
[facility] Provider Note: Lowella Dell, MD, FACEP  CSN: [id number] MRN: [id number] ARRIVAL: 01/10/17 at 0557 ROOM: [facility]10/[facility]10   CHIEF COMPLAINT  Flank Pain   HISTORY OF PRESENT ILLNESS  Courtney Landry is a 46 y.o. female with history of kidney stones. She is here with left flank pain that began yesterday. It was mild at the beginning and was intermittent. It worsened overnight and has become severe. It is located in the left flank radiating to the left lower quadrant of the abdomen. She has had nausea but no vomiting. She has had no fever or chills. She has had no dysuria or hematuria. She cannot find a comfortable position. She characterizes the pain as like previous kidney stones.   Past Medical History:  Diagnosis Date  . Kidney stones   . Major depressive disorder   . Migraine   . Palpitation   . UTI (urinary tract infection)     Past Surgical History:  Procedure Laterality Date  . APPENDECTOMY    . CESAREAN SECTION    . CHOLECYSTECTOMY    . TUBAL LIGATION      No family history on file.  Social History  Substance Use Topics  . Smoking status: Current Every Day Smoker    Packs/day: 1.00    Types: Cigarettes  . Smokeless tobacco: Never Used  . Alcohol use No    Prior to Admission medications   Medication Sig Start Date End Date Taking? Authorizing Provider  DULoxetine HCl (CYMBALTA PO) Take by mouth.   Yes Historical Provider, MD  venlafaxine (EFFEXOR) 100 MG tablet Take 225 mg by mouth daily.   Yes Historical Provider, MD  atenolol (TENORMIN) 50 MG tablet Take 50 mg by mouth 2 (two) times daily.    Historical Provider, MD  BuPROPion HCl (WELLBUTRIN PO) Take by mouth.    Historical Provider, MD  butalbital-acetaminophen-caffeine (FIORICET, ESGIC) [phone] MG tablet Take 1-2 tablets by mouth every 6 (six) hours as needed for headache. 12/08/16 12/08/17  Renne Crigler, PA-C  ibuprofen (ADVIL,MOTRIN) 800 MG tablet Take 800 mg by mouth every 8 (eight)  hours as needed. Patient used this medication for pain.    Historical Provider, MD    Allergies Patient has no known allergies.   REVIEW OF SYSTEMS  Negative except as noted here or in the History of Present Illness.   PHYSICAL EXAMINATION  Initial Vital Signs Blood pressure 101/73, pulse 90, temperature 97.7 F (36.5 C), temperature source Oral, resp. rate (!) 22, height  (1.6 m), weight 220 lb (99.8 kg), SpO2 99 %.  Examination General: Well-developed, well-nourished female, appears uncomfortable; appearance consistent with age of record HENT: normocephalic; atraumatic Eyes: pupils equal, round and reactive to light; extraocular muscles intact Neck: supple Heart: regular rate and rhythm Lungs: clear to auscultation bilaterally Abdomen: soft; nondistended; left lower quadrant tenderness; no masses or hepatosplenomegaly; bowel sounds present GU: No CVA tenderness Extremities: No deformity; full range of motion; pulses normal Neurologic: Awake, alert and oriented; motor function intact in all extremities and symmetric; no facial droop Skin: Warm and dry Psychiatric: Grimacing   RESULTS  Summary of this visit's results, reviewed by myself:   EKG Interpretation  Date/Time:    Ventricular Rate:    PR Interval:    QRS Duration:   QT Interval:    QTC Calculation:   R Axis:     Text Interpretation:        Laboratory Studies: Results for orders placed or performed during the  hospital encounter of 01/10/17 (from the past 24 hour(s))  Urinalysis, Routine w reflex microscopic     Status: Abnormal   Collection Time: 01/10/17  6:02 AM  Result Value Ref Range   Color, Urine YELLOW YELLOW   APPearance CLEAR CLEAR   Specific Gravity, Urine 1.006 1.005 - 1.030   pH 6.0 5.0 - 8.0   Glucose, UA NEGATIVE NEGATIVE mg/dL   Hgb urine dipstick TRACE (A) NEGATIVE   Bilirubin Urine NEGATIVE NEGATIVE   Ketones, ur NEGATIVE NEGATIVE mg/dL   Protein, ur NEGATIVE NEGATIVE mg/dL    Nitrite POSITIVE (A) NEGATIVE   Leukocytes, UA TRACE (A) NEGATIVE  Pregnancy, urine     Status: None   Collection Time: 01/10/17  6:02 AM  Result Value Ref Range   Preg Test, Ur NEGATIVE NEGATIVE  Urinalysis, Microscopic (reflex)     Status: Abnormal   Collection Time: 01/10/17  6:02 AM  Result Value Ref Range   RBC / HPF 0-5 0 - 5 RBC/hpf   WBC, UA 0-5 0 - 5 WBC/hpf   Bacteria, UA MANY (A) NONE SEEN   Squamous Epithelial / LPF 0-5 (A) NONE SEEN   Imaging Studies: Ct Renal Stone Study  Result Date: 01/10/2017 CLINICAL DATA:  Acute onset of intermittent left flank pain. Initial encounter. EXAM: CT ABDOMEN AND PELVIS WITHOUT CONTRAST TECHNIQUE: Multidetector CT imaging of the abdomen and pelvis was performed following the standard protocol without IV contrast. COMPARISON:  CT of the abdomen and pelvis from 12/07/2011, and MRI of the lumbar spine performed 01/21/2016 FINDINGS: Lower chest: Minimal left basilar atelectasis is noted. The visualized portions of the mediastinum are unremarkable. Bilateral breast implants are partially imaged. Hepatobiliary: The liver is unremarkable in appearance. The patient is status post cholecystectomy, with clips noted at the gallbladder fossa. The common bile duct remains normal in caliber. Pancreas: The pancreas is within normal limits. Spleen: The spleen is unremarkable in appearance. Adrenals/Urinary Tract: The adrenal glands are unremarkable in appearance. Moderate left-sided hydronephrosis is noted, with diffuse prominence of the left ureter along its entire course. Two obstructing stones are noted within the distal left ureter and at the left vesicoureteral junction, measuring 9 x 8 mm and 4 x 4 mm in size. Nonobstructing bilateral renal stones are seen bilaterally, measuring up to 2 mm in size. Diffuse left-sided perinephric stranding is noted. Stomach/Bowel: The stomach is unremarkable in appearance. The small bowel is within normal limits. The appendix is  normal in caliber, without evidence of appendicitis. The colon is unremarkable in appearance. Vascular/Lymphatic: Minimal calcification is seen along the distal abdominal aorta and its branches. No retroperitoneal or pelvic sidewall lymphadenopathy is seen. Reproductive: The bladder is mildly distended and within normal limits. The uterus is grossly unremarkable in appearance. The ovaries are relatively symmetric. No suspicious adnexal masses are seen. Other: No additional soft tissue abnormalities are seen. Musculoskeletal: No acute osseous abnormalities are identified. The visualized musculature is unremarkable in appearance. IMPRESSION: 1. Moderate left-sided hydronephrosis, with diffuse prominence of the left ureter, and two obstructing stones at the distal left ureter and left vesicoureteral junction, measuring 9 x 8 mm and 4 x 4 mm. The larger stone is unlikely to pass on its own, given its size. 2. Nonobstructing bilateral renal stones measure up to 2 mm size. Electronically Signed   By: Roanna Raider M.D.   On: 01/10/2017 06:49    ED COURSE  Nursing notes and initial vitals signs, including pulse oximetry, reviewed.  Vitals:   01/10/17  0604 01/10/17 0606  BP: 101/73   Pulse: 90   Resp: (!) 22   Temp: 97.7 F (36.5 C)   TempSrc: Oral   SpO2: 99%   Weight:  220 lb (99.8 kg)  Height:   (1.6 m)   7:03 AM Pain improved after first dose of Dilaudid. Patient rates her pain as a 7 out of 10 down from a 10 out of 10 on arrival. Second dose of Dilaudid ordered.  PROCEDURES    ED DIAGNOSES     ICD-9-CM ICD-10-CM   1. Ureterolithiasis 592.1 N20.1        Paula Libra, MD 01/10/17 2233

## 2017-01-10 NOTE — ED Triage Notes (Signed)
Pt c/o intermittent L flank pain that started yesterday. Hx of kidney stones, "this feels same".

## 2017-08-09 DIAGNOSIS — Z9884 Bariatric surgery status: Secondary | ICD-10-CM | POA: Insufficient documentation

## 2018-07-28 DIAGNOSIS — N1330 Unspecified hydronephrosis: Secondary | ICD-10-CM | POA: Diagnosis not present

## 2018-07-28 DIAGNOSIS — R109 Unspecified abdominal pain: Secondary | ICD-10-CM | POA: Diagnosis not present

## 2018-07-28 DIAGNOSIS — I700 Atherosclerosis of aorta: Secondary | ICD-10-CM | POA: Diagnosis not present

## 2018-07-28 DIAGNOSIS — N201 Calculus of ureter: Secondary | ICD-10-CM | POA: Diagnosis not present

## 2018-07-28 DIAGNOSIS — N202 Calculus of kidney with calculus of ureter: Secondary | ICD-10-CM | POA: Diagnosis not present

## 2018-08-01 DIAGNOSIS — F32A Depression, unspecified: Secondary | ICD-10-CM | POA: Insufficient documentation

## 2018-08-01 DIAGNOSIS — Z87442 Personal history of urinary calculi: Secondary | ICD-10-CM | POA: Insufficient documentation

## 2018-08-02 DIAGNOSIS — I10 Essential (primary) hypertension: Secondary | ICD-10-CM | POA: Diagnosis not present

## 2018-08-02 DIAGNOSIS — N201 Calculus of ureter: Secondary | ICD-10-CM | POA: Diagnosis not present

## 2018-08-02 DIAGNOSIS — R69 Illness, unspecified: Secondary | ICD-10-CM | POA: Diagnosis not present

## 2018-08-02 DIAGNOSIS — Z87442 Personal history of urinary calculi: Secondary | ICD-10-CM | POA: Diagnosis not present

## 2018-08-31 DIAGNOSIS — N390 Urinary tract infection, site not specified: Secondary | ICD-10-CM | POA: Diagnosis not present

## 2018-08-31 DIAGNOSIS — N200 Calculus of kidney: Secondary | ICD-10-CM | POA: Diagnosis not present

## 2018-08-31 DIAGNOSIS — R8290 Unspecified abnormal findings in urine: Secondary | ICD-10-CM | POA: Diagnosis not present

## 2018-08-31 DIAGNOSIS — N201 Calculus of ureter: Secondary | ICD-10-CM | POA: Diagnosis not present

## 2018-09-12 DIAGNOSIS — G43911 Migraine, unspecified, intractable, with status migrainosus: Secondary | ICD-10-CM | POA: Diagnosis not present

## 2018-09-12 DIAGNOSIS — M5416 Radiculopathy, lumbar region: Secondary | ICD-10-CM | POA: Diagnosis not present

## 2018-09-19 DIAGNOSIS — N200 Calculus of kidney: Secondary | ICD-10-CM | POA: Diagnosis not present

## 2018-09-29 DIAGNOSIS — N200 Calculus of kidney: Secondary | ICD-10-CM | POA: Diagnosis not present

## 2019-04-05 ENCOUNTER — Ambulatory Visit: Payer: 59 | Admitting: Psychiatry

## 2019-11-20 ENCOUNTER — Ambulatory Visit: Payer: Self-pay | Admitting: Neurology

## 2020-01-22 ENCOUNTER — Ambulatory Visit: Payer: Self-pay | Admitting: Neurology

## 2021-01-15 ENCOUNTER — Emergency Department (HOSPITAL_BASED_OUTPATIENT_CLINIC_OR_DEPARTMENT_OTHER)
Admission: EM | Admit: 2021-01-15 | Discharge: 2021-01-15 | Disposition: A | Discharge status: Home / Self Care | Payer: No Typology Code available for payment source | Attending: Emergency Medicine | Admitting: Emergency Medicine

## 2021-01-15 ENCOUNTER — Other Ambulatory Visit: Payer: Self-pay

## 2021-01-15 ENCOUNTER — Encounter (HOSPITAL_BASED_OUTPATIENT_CLINIC_OR_DEPARTMENT_OTHER): Payer: Self-pay | Admitting: *Deleted

## 2021-01-15 DIAGNOSIS — U071 COVID-19: Secondary | ICD-10-CM | POA: Insufficient documentation

## 2021-01-15 DIAGNOSIS — Z5321 Procedure and treatment not carried out due to patient leaving prior to being seen by health care provider: Secondary | ICD-10-CM | POA: Diagnosis not present

## 2021-01-15 DIAGNOSIS — R0602 Shortness of breath: Secondary | ICD-10-CM | POA: Diagnosis present

## 2021-01-15 NOTE — ED Triage Notes (Addendum)
C/o SOB with exertion x 1 day , dx covid x 2 weeks ago reports improved covid sx till today

## 2021-07-03 NOTE — Progress Notes (Signed)
Referring:  Clearnce Hasten, PA-C [address] [place],  [place] [address]  PCP: Clearnce Hasten, [place]  Neurology was asked to evaluate Courtney Landry, a 50 year old female for a chief complaint of headaches.  Our recommendations of care will be communicated by shared medical record.    CC:  headaches  HPI:  Medical co-morbidities: depression, anxiety, POTS, nephrolithiasis, s/p gastric sleeve  The patient presents for evaluation of headaches which have been present since she was 50 years old. She has had a daily headache for the past 2 months. Headaches are typically worse when the weather changes. She also changed doses in her Lamictal around that time. Prior to this was having one headache ever 2-3 weeks. She does have pain free moments. Has daily 5-6/10 headaches and has had two 9/10 migraines in the past 2 months. Migraines are associated with photophobia, phonophobia, nausea, and vomiting. She has nausea nearly every morning.  She has been taking Excedrin migraine every day for the past 2 months. It helps take the edge off but the pain always returns.   Headache History: Onset: 2 months Triggers: none Aura: spots, stars lasting a couple of minutes Location:  temple, occiput R>L Quality/Description: throbbing Severity: 6-9/10 Associated Symptoms:  Photophobia: yes  Phonophobia: yes  Nausea: yes Vomiting: yes Worse with activity?: yes Duration of headaches: 24-28 hours Red flags:   Change in pattern of headache  Headache days per month: 30 Headache free days per month: 0  Current Treatment: Abortive Excedrin  Preventative none  Prior Therapies                                 Seroquel 25 mg QHS Lamictal 150 mg BID Effexor  Cymbalta Atenolol  Wellbutrin Fioricet  Maxalt - felt jittery, sick  Headache Risk Factors: Headache risk factors and/or co-morbidities (-) Neck Pain (-) Back Pain (-) History of Motor Vehicle Accident (-) Sleep Disorder (-)  Fibromyalgia (-) Obesity  Body mass index is 30.11 kg/m. (-) History of Traumatic Brain Injury and/or Concussion  LABS: 01/24/21: vitamin D wnl  IMAGING:  N/a   Current Outpatient Medications on File Prior to Visit  Medication Sig Dispense Refill   ALPRAZolam (XANAX) 1 MG tablet Take 1 tablet by mouth 2 (two) times daily as needed.     butalbital-acetaminophen-caffeine (FIORICET) 50-325-40 MG tablet TAKE 1-2 TABLETS BY MOUTH 3 TIMES DAILY AS NEEDED FOR HEADACHES. SCHEDULE APPT FOR FURTHER REFILLS     lamoTRIgine (LAMICTAL) 150 MG tablet One tab at 8 AM and one at bed time     QUEtiapine (SEROQUEL) 25 MG tablet Take 25 mg by mouth at bedtime.     Vilazodone HCl (VIIBRYD) 40 MG TABS TAKE ONE TABLET BY MOUTH DAILY. TAKE WITH FOOD     HYDROmorphone (DILAUDID) 4 MG tablet Take 1 tablet (4 mg total) by mouth every 4 (four) hours as needed (for pain). (Patient not taking: Reported on 07/07/2021) 30 tablet 0   No current facility-administered medications on file prior to visit.     Allergies: Allergies  Allergen Reactions   Hydrocodone-Acetaminophen Nausea And Vomiting   Tramadol Nausea Only and Nausea And Vomiting    And headache     Family History: Migraine or other headaches in the family:  mother Aneurysms in a first degree relative:  no Brain tumors in the family:  no Other neurological illness in the family:  no  Past Medical History: Past Medical History:  Diagnosis Date   Kidney stones    Major depressive disorder    Migraine    Palpitation    UTI (urinary tract infection)     Past Surgical History Past Surgical History:  Procedure Laterality Date   APPENDECTOMY     CESAREAN SECTION     CHOLECYSTECTOMY     TUBAL LIGATION      Social History: Social History   Tobacco Use   Smoking status: Every Day    Packs/day: 1.00    Types: Cigarettes   Smokeless tobacco: Never  Substance Use Topics   Alcohol use: Yes    Comment: once every 2 months   Drug use:  No    ROS: Negative for fevers, chills. Positive for headaches. All other systems reviewed and negative unless stated otherwise in HPI.   Physical Exam:   Vital Signs: BP 132/74   Pulse 78   Ht 5\' 3"  (1.6 m)   Wt 170 lb (77.1 kg)   SpO2 95%   BMI 30.11 kg/m  GENERAL: well appearing,in no acute distress,alert SKIN:  Color, texture, turgor normal. No rashes or lesions HEAD:  Normocephalic/atraumatic. CV:  RRR RESP: Normal respiratory effort MSK: +tenderness to palpation over bilateral temples, right occiput  NEUROLOGICAL: Mental Status: Alert, oriented to person, place and time,Follows commands Cranial Nerves: PERRL,visual fields intact to confrontation,extraocular movements intact,facial sensation intact,no facial droop or ptosis,hearing intact to finger rub bilaterally,no dysarthria,palate elevate symmetrically,tongue protrudes midline,shoulder shrug intact and symmetric Motor: muscle strength 5/5 both upper and lower extremities,no drift, normal tone Reflexes: 2+ throughout Sensation: intact to light touch all 4 extremities Coordination: Finger-to- nose-finger intact bilaterally,Heel-to-shin intact bilaterally Gait: normal-based   IMPRESSION: 50 year old female with a history of migraines, gastric sleeve, depression, nephrolithiasis, POTS who presents for evaluation of daily headaches for the past 2 months. Neurological exam today is unremarkable. Muscle relaxers x5 days prescribed to help break current headache cycle. If unable to break her headache will plan to start Ajovy for headache prevention. She has been unable to find an effective rescue that does not cause side effects. Will start naratriptan as it tends to have a more favorable side effect profile compared to other triptans.  PLAN: -Parafon forte 500 mg TID x5 days to break current headache cycle -If unable to break headache, start Ajovy monthly for prevention -Start naratriptan 2.5 mg PRN for migraines -Next  steps: consider Botox, qulipta for prevention, or gepant for rescue  I spent a total of 35 minutes chart reviewing and counseling the patient. Headache education was done. Discussed treatment options including preventive and acute medications, natural supplements, and physical therapy. Discussed medication overuse headache and to limit use of acute treatments to no more than 2 days/week or 10 days/month. Discussed medication side effects, adverse reactions and drug interactions. Written educational materials and patient instructions outlining all of the above were given.  Follow-up: 3 months   44, MD 07/07/2021   8:43 AM

## 2021-07-07 ENCOUNTER — Ambulatory Visit (INDEPENDENT_AMBULATORY_CARE_PROVIDER_SITE_OTHER): Payer: No Typology Code available for payment source | Admitting: Psychiatry

## 2021-07-07 ENCOUNTER — Other Ambulatory Visit: Payer: Self-pay

## 2021-07-07 ENCOUNTER — Encounter: Payer: Self-pay | Admitting: Psychiatry

## 2021-07-07 VITALS — BP 132/74 | HR 78 | Ht 63.0 in | Wt 170.0 lb

## 2021-07-07 DIAGNOSIS — G43709 Chronic migraine without aura, not intractable, without status migrainosus: Secondary | ICD-10-CM

## 2021-07-07 DIAGNOSIS — Z8669 Personal history of other diseases of the nervous system and sense organs: Secondary | ICD-10-CM | POA: Insufficient documentation

## 2021-07-07 MED ORDER — CHLORZOXAZONE 250 MG PO TABS: 250.0000 mg | ORAL_TABLET | Freq: Three times a day (TID) | ORAL | 0 refills | Status: AC | PRN

## 2021-07-07 MED ORDER — NARATRIPTAN HCL 2.5 MG PO TABS: 2.5000 mg | ORAL_TABLET | ORAL | 0 refills | Status: DC | PRN

## 2021-07-07 NOTE — Patient Instructions (Addendum)
Take chlorzoxazone 500 mg up to three times daily for 5 days to help break your current headache cycle If you still have a headache after the 5 days of muscle relaxers, take Ajovy injectable. Let me know if you take it and I will write a reoccurring prescription for you. Start naratriptan as needed for migraine. Take at the onset of migraine. If headache recurs or does not fully resolve, you may take a second dose after 4 hours. Please avoid taking more than 2 days per week or 10 days per month. Try to limit excedrin to 2 days per week or less

## 2021-07-14 ENCOUNTER — Telehealth: Payer: Self-pay

## 2021-07-14 MED ORDER — AJOVY 225 MG/1.5ML ~~LOC~~ SOAJ: 225.0000 mg | SUBCUTANEOUS | 2 refills | Status: DC

## 2021-07-14 NOTE — Telephone Encounter (Signed)
AJOVY INJ 225/1.5 is approved through 01/12/2022.  Pt is aware

## 2021-07-14 NOTE — Telephone Encounter (Addendum)
PA submitted via CMM: Key: [id number] - PA Case ID: [id number] - Rx #: [id number]   Your information has been sent to [facility]. Typically an electronic response will be received within 24-72 hour

## 2021-08-03 ENCOUNTER — Other Ambulatory Visit: Payer: Self-pay | Admitting: Psychiatry

## 2021-10-08 ENCOUNTER — Ambulatory Visit: Payer: No Typology Code available for payment source | Admitting: Psychiatry

## 2021-10-09 ENCOUNTER — Encounter: Payer: Self-pay | Admitting: Psychiatry

## 2021-10-09 ENCOUNTER — Ambulatory Visit (INDEPENDENT_AMBULATORY_CARE_PROVIDER_SITE_OTHER): Payer: No Typology Code available for payment source | Admitting: Psychiatry

## 2021-10-09 VITALS — BP 118/76 | HR 82 | Ht 63.0 in | Wt 182.0 lb

## 2021-10-09 DIAGNOSIS — G43719 Chronic migraine without aura, intractable, without status migrainosus: Secondary | ICD-10-CM

## 2021-10-09 DIAGNOSIS — R519 Headache, unspecified: Secondary | ICD-10-CM

## 2021-10-09 NOTE — Progress Notes (Signed)
° °  CC:  headaches  Follow-up Visit  Last visit: 07/07/21  Brief HPI: 51 year old female with a history of depression, anxiety, POTS, nephrolithiasis, s/p gastric sleeve who follows in clinic for chronic migraines.  At her last visit she was having daily headaches. Was noted to be overusing Excedrin migraine and was counseled on limiting OTC use. She was given parafon forte x5 days to help break her headaches and started on Ajovy for prevention. Naratriptan started for rescue.  Interval History: Since her last visit she is about the same. Continues to have daily dull headaches with ~1 migraine per month. She has tried to cut back on Excedrin but is still taking it 5 times per week. She has had 2 Ajovy shots and her 3rd one is due next week. Tried naratriptan which didn't make a difference.  She did not find muscle relaxers helpful.   Headache days per month: 30 Headache free days per month: 0  Current Headache Regimen: Preventative: Ajovy Abortive: Excedrin  Prior Therapies                                  Seroquel 25 mg QHS Lamictal 150 mg BID Effexor  Cymbalta Atenolol  Wellbutrin Ajovy Topamax contraindicated due to kidney stones Fioricet  Maxalt - felt jittery, sick Naratriptan - lack of efficacy  Physical Exam:   Vital Signs: BP 118/76    Pulse 82    Ht 5\' 3"  (1.6 m)    Wt 182 lb (82.6 kg)    BMI 32.24 kg/m  GENERAL:  well appearing, in no acute distress, alert  SKIN:  Color, texture, turgor normal. No rashes or lesions HEAD:  Normocephalic/atraumatic. RESP: normal respiratory effort MSK:  +tender to palpation over bilateral neck and shoulders  NEUROLOGICAL: Mental Status: Alert, oriented to person, place and time, Follows commands, and Speech fluent and appropriate. Cranial Nerves: PERRL, face symmetric, no dysarthria, hearing grossly intact Motor: moves all extremities equally Gait: normal-based.  IMPRESSION: 51 year old female with a history of  depression, anxiety, POTS, nephrolithiasis, s/p gastric sleeve who presents for follow up of chronic daily headaches and migraines. She has not had significant improvement in her headaches on Ajovy. MRI brain ordered to assess for structural causes of worsening headache despite treatment. She is due for her third shot of Ajovy next week. Will see how she does after this injection and if she still does not have improvement will plan to start Botox. She has failed 2 triptans for rescue. Nurtec samples provided, will send in prescription if they are effective.  PLAN: -MRI brain with contrast -Preventive: Continue Ajovy for now. If no improvement after 3rd shot of Ajovy will plan to switch to Botox -Rescue: Nurtec samples provided -Next steps: consider Botox, qulipta, gabapentin, neck PT   Follow-up: 3 months (or sooner for Botox)  I spent a total of 26 minutes on the date of the service. Headache education was done. Discussed treatment options including preventive and acute medications. Discussed medication overuse headache. Discussed medication side effects, adverse reactions and drug interactions. Written educational materials and patient instructions outlining all of the above were given.  44, MD 10/09/21 1:53 PM

## 2021-10-09 NOTE — Patient Instructions (Addendum)
Try Nurtec as needed for migraines Let me know if daily headaches do not improve 2 weeks after next Ajovy injection MRI brain

## 2021-10-12 ENCOUNTER — Emergency Department (HOSPITAL_COMMUNITY): Payer: No Typology Code available for payment source | Admitting: Anesthesiology

## 2021-10-12 ENCOUNTER — Emergency Department (HOSPITAL_BASED_OUTPATIENT_CLINIC_OR_DEPARTMENT_OTHER): Payer: No Typology Code available for payment source

## 2021-10-12 ENCOUNTER — Encounter (HOSPITAL_BASED_OUTPATIENT_CLINIC_OR_DEPARTMENT_OTHER): Payer: Self-pay | Admitting: Emergency Medicine

## 2021-10-12 ENCOUNTER — Other Ambulatory Visit: Payer: Self-pay

## 2021-10-12 ENCOUNTER — Ambulatory Visit (HOSPITAL_BASED_OUTPATIENT_CLINIC_OR_DEPARTMENT_OTHER)
Admission: EM | Admit: 2021-10-12 | Discharge: 2021-10-12 | Disposition: A | Discharge status: Home / Self Care | Payer: No Typology Code available for payment source | Attending: Surgery | Admitting: Surgery

## 2021-10-12 ENCOUNTER — Encounter (HOSPITAL_COMMUNITY)
Admission: EM | Disposition: A | Discharge status: Home / Self Care | Payer: Self-pay | Source: Home / Self Care | Attending: Emergency Medicine

## 2021-10-12 DIAGNOSIS — Z20822 Contact with and (suspected) exposure to covid-19: Secondary | ICD-10-CM | POA: Diagnosis not present

## 2021-10-12 DIAGNOSIS — F17210 Nicotine dependence, cigarettes, uncomplicated: Secondary | ICD-10-CM | POA: Insufficient documentation

## 2021-10-12 DIAGNOSIS — K3580 Unspecified acute appendicitis: Secondary | ICD-10-CM | POA: Diagnosis not present

## 2021-10-12 DIAGNOSIS — K353 Acute appendicitis with localized peritonitis, without perforation or gangrene: Secondary | ICD-10-CM

## 2021-10-12 DIAGNOSIS — Z9884 Bariatric surgery status: Secondary | ICD-10-CM | POA: Insufficient documentation

## 2021-10-12 HISTORY — PX: LAPAROSCOPIC APPENDECTOMY: SHX408

## 2021-10-12 LAB — URINALYSIS, ROUTINE W REFLEX MICROSCOPIC
Bilirubin Urine: NEGATIVE
Glucose, UA: NEGATIVE mg/dL
Hgb urine dipstick: NEGATIVE
Ketones, ur: NEGATIVE mg/dL
Nitrite: NEGATIVE
Protein, ur: NEGATIVE mg/dL
Specific Gravity, Urine: 1.01 (ref 1.005–1.030)
pH: 5.5 (ref 5.0–8.0)

## 2021-10-12 LAB — CBC WITH DIFFERENTIAL/PLATELET
Abs Immature Granulocytes: 0.05 10*3/uL (ref 0.00–0.07)
Basophils Absolute: 0.1 10*3/uL (ref 0.0–0.1)
Basophils Relative: 1 %
Eosinophils Absolute: 1.1 10*3/uL — ABNORMAL HIGH (ref 0.0–0.5)
Eosinophils Relative: 7 %
HCT: 36.2 % (ref 36.0–46.0)
Hemoglobin: 12.3 g/dL (ref 12.0–15.0)
Immature Granulocytes: 0 %
Lymphocytes Relative: 25 %
Lymphs Abs: 4 10*3/uL (ref 0.7–4.0)
MCH: 30.2 pg (ref 26.0–34.0)
MCHC: 34 g/dL (ref 30.0–36.0)
MCV: 88.9 fL (ref 80.0–100.0)
Monocytes Absolute: 0.8 10*3/uL (ref 0.1–1.0)
Monocytes Relative: 5 %
Neutro Abs: 10.2 10*3/uL — ABNORMAL HIGH (ref 1.7–7.7)
Neutrophils Relative %: 62 %
Platelets: 342 10*3/uL (ref 150–400)
RBC: 4.07 MIL/uL (ref 3.87–5.11)
RDW: 13.9 % (ref 11.5–15.5)
WBC: 16.3 10*3/uL — ABNORMAL HIGH (ref 4.0–10.5)
nRBC: 0 % (ref 0.0–0.2)

## 2021-10-12 LAB — COMPREHENSIVE METABOLIC PANEL
ALT: 19 U/L (ref 0–44)
AST: 19 U/L (ref 15–41)
Albumin: 3.8 g/dL (ref 3.5–5.0)
Alkaline Phosphatase: 67 U/L (ref 38–126)
Anion gap: 10 (ref 5–15)
BUN: 11 mg/dL (ref 6–20)
CO2: 25 mmol/L (ref 22–32)
Calcium: 9.6 mg/dL (ref 8.9–10.3)
Chloride: 103 mmol/L (ref 98–111)
Creatinine, Ser: 0.83 mg/dL (ref 0.44–1.00)
GFR, Estimated: >60 mL/min (ref >60)
Glucose, Bld: 104 mg/dL — ABNORMAL HIGH (ref 70–99)
Potassium: 3.6 mmol/L (ref 3.5–5.1)
Sodium: 138 mmol/L (ref 135–145)
Total Bilirubin: 0.7 mg/dL (ref 0.3–1.2)
Total Protein: 6.8 g/dL (ref 6.5–8.1)

## 2021-10-12 LAB — URINALYSIS, MICROSCOPIC (REFLEX)

## 2021-10-12 LAB — RESP PANEL BY RT-PCR (FLU A&B, COVID) ARPGX2
Influenza A by PCR: NEGATIVE
Influenza B by PCR: NEGATIVE
SARS Coronavirus 2 by RT PCR: NEGATIVE

## 2021-10-12 LAB — LIPASE, BLOOD: Lipase: 37 U/L (ref 11–51)

## 2021-10-12 SURGERY — APPENDECTOMY, LAPAROSCOPIC
Anesthesia: General | Site: Abdomen

## 2021-10-12 MED ORDER — HYDROMORPHONE HCL 1 MG/ML IJ SOLN
INTRAMUSCULAR | Status: AC
  Filled 2021-10-12: qty 2

## 2021-10-12 MED ORDER — ONDANSETRON HCL 4 MG/2ML IJ SOLN
INTRAMUSCULAR | Status: AC
  Filled 2021-10-12: qty 2

## 2021-10-12 MED ORDER — CHLORHEXIDINE GLUCONATE CLOTH 2 % EX PADS: 6.0000 | MEDICATED_PAD | Freq: Once | CUTANEOUS | Status: DC

## 2021-10-12 MED ORDER — LIDOCAINE HCL (PF) 2 % IJ SOLN
INTRAMUSCULAR | Status: AC
  Filled 2021-10-12: qty 5

## 2021-10-12 MED ORDER — LACTATED RINGERS IV SOLN: INTRAVENOUS | Status: DC | PRN

## 2021-10-12 MED ORDER — KETOROLAC TROMETHAMINE 30 MG/ML IJ SOLN
INTRAMUSCULAR | Status: DC | PRN
  Administered 2021-10-12: 30 mg via INTRAVENOUS

## 2021-10-12 MED ORDER — PROPOFOL 10 MG/ML IV BOLUS
INTRAVENOUS | Status: AC
  Filled 2021-10-12: qty 20

## 2021-10-12 MED ORDER — SUGAMMADEX SODIUM 200 MG/2ML IV SOLN
INTRAVENOUS | Status: DC | PRN
  Administered 2021-10-12: 200 mg via INTRAVENOUS

## 2021-10-12 MED ORDER — HYDROMORPHONE HCL 1 MG/ML IJ SOLN: 0.2500 mg | INTRAMUSCULAR | Status: DC | PRN

## 2021-10-12 MED ORDER — ACETAMINOPHEN 10 MG/ML IV SOLN
INTRAVENOUS | Status: DC | PRN
  Administered 2021-10-12: 1000 mg via INTRAVENOUS

## 2021-10-12 MED ORDER — HYDROMORPHONE HCL 1 MG/ML IJ SOLN
1.0000 mg | Freq: Once | INTRAMUSCULAR | Status: AC
  Administered 2021-10-12: 1 mg via INTRAVENOUS
  Filled 2021-10-12: qty 1

## 2021-10-12 MED ORDER — LACTATED RINGERS IV BOLUS
1000.0000 mL | Freq: Once | INTRAVENOUS | Status: AC
  Administered 2021-10-12: 1000 mL via INTRAVENOUS

## 2021-10-12 MED ORDER — IOHEXOL 300 MG/ML  SOLN
100.0000 mL | Freq: Once | INTRAMUSCULAR | Status: AC | PRN
  Administered 2021-10-12: 100 mL via INTRAVENOUS

## 2021-10-12 MED ORDER — DEXAMETHASONE SODIUM PHOSPHATE 10 MG/ML IJ SOLN
INTRAMUSCULAR | Status: DC | PRN
  Administered 2021-10-12: 10 mg via INTRAVENOUS

## 2021-10-12 MED ORDER — FENTANYL CITRATE (PF) 250 MCG/5ML IJ SOLN
INTRAMUSCULAR | Status: AC
  Filled 2021-10-12: qty 5

## 2021-10-12 MED ORDER — FENTANYL CITRATE (PF) 250 MCG/5ML IJ SOLN
INTRAMUSCULAR | Status: DC | PRN
Start: 2021-10-12 — End: 2021-10-12
  Administered 2021-10-12 (×5): 50 ug via INTRAVENOUS

## 2021-10-12 MED ORDER — ONDANSETRON HCL 4 MG/2ML IJ SOLN
INTRAMUSCULAR | Status: DC | PRN
  Administered 2021-10-12: 4 mg via INTRAVENOUS

## 2021-10-12 MED ORDER — SODIUM CHLORIDE 0.9 % IV SOLN
2.0000 g | INTRAVENOUS | Status: DC
  Filled 2021-10-12: qty 2

## 2021-10-12 MED ORDER — OXYCODONE HCL 5 MG PO TABS
5.0000 mg | ORAL_TABLET | Freq: Once | ORAL | Status: AC
  Administered 2021-10-12: 5 mg via ORAL

## 2021-10-12 MED ORDER — 0.9 % SODIUM CHLORIDE (POUR BTL) OPTIME
TOPICAL | Status: DC | PRN
  Administered 2021-10-12: 1000 mL

## 2021-10-12 MED ORDER — CEFAZOLIN SODIUM-DEXTROSE 2-4 GM/100ML-% IV SOLN
INTRAVENOUS | Status: AC
  Filled 2021-10-12: qty 100

## 2021-10-12 MED ORDER — SODIUM CHLORIDE 0.9 % IV SOLN
2.0000 g | Freq: Once | INTRAVENOUS | Status: AC
  Administered 2021-10-12: 2 g via INTRAVENOUS
  Filled 2021-10-12: qty 20

## 2021-10-12 MED ORDER — LIDOCAINE 2% (20 MG/ML) 5 ML SYRINGE
INTRAMUSCULAR | Status: DC | PRN
  Administered 2021-10-12: 60 mg via INTRAVENOUS

## 2021-10-12 MED ORDER — ROCURONIUM BROMIDE 10 MG/ML (PF) SYRINGE
PREFILLED_SYRINGE | INTRAVENOUS | Status: DC | PRN
  Administered 2021-10-12: 40 mg via INTRAVENOUS

## 2021-10-12 MED ORDER — LACTATED RINGERS IR SOLN
Status: DC | PRN
  Administered 2021-10-12: 1000 mL

## 2021-10-12 MED ORDER — ACETAMINOPHEN 10 MG/ML IV SOLN
INTRAVENOUS | Status: AC
  Filled 2021-10-12: qty 100

## 2021-10-12 MED ORDER — ONDANSETRON HCL 4 MG/2ML IJ SOLN
4.0000 mg | Freq: Once | INTRAMUSCULAR | Status: AC
  Administered 2021-10-12: 4 mg via INTRAVENOUS
  Filled 2021-10-12: qty 2

## 2021-10-12 MED ORDER — ROCURONIUM BROMIDE 10 MG/ML (PF) SYRINGE
PREFILLED_SYRINGE | INTRAVENOUS | Status: AC
  Filled 2021-10-12: qty 10

## 2021-10-12 MED ORDER — SUCCINYLCHOLINE CHLORIDE 200 MG/10ML IV SOSY
PREFILLED_SYRINGE | INTRAVENOUS | Status: DC | PRN
Start: 2021-10-12 — End: 2021-10-12
  Administered 2021-10-12: 100 mg via INTRAVENOUS

## 2021-10-12 MED ORDER — METRONIDAZOLE 500 MG/100ML IV SOLN
500.0000 mg | Freq: Once | INTRAVENOUS | Status: AC
  Administered 2021-10-12: 500 mg via INTRAVENOUS
  Filled 2021-10-12: qty 100

## 2021-10-12 MED ORDER — PROPOFOL 10 MG/ML IV BOLUS
INTRAVENOUS | Status: DC | PRN
  Administered 2021-10-12: 150 mg via INTRAVENOUS

## 2021-10-12 MED ORDER — MIDAZOLAM HCL 2 MG/2ML IJ SOLN
INTRAMUSCULAR | Status: AC
  Filled 2021-10-12: qty 2

## 2021-10-12 MED ORDER — SODIUM CHLORIDE 0.9 % IV BOLUS
1000.0000 mL | Freq: Once | INTRAVENOUS | Status: AC
  Administered 2021-10-12: 1000 mL via INTRAVENOUS

## 2021-10-12 MED ORDER — MORPHINE SULFATE (PF) 4 MG/ML IV SOLN
4.0000 mg | INTRAVENOUS | Status: DC | PRN
  Administered 2021-10-12: 4 mg via INTRAVENOUS
  Filled 2021-10-12: qty 1

## 2021-10-12 MED ORDER — DEXAMETHASONE SODIUM PHOSPHATE 10 MG/ML IJ SOLN
INTRAMUSCULAR | Status: AC
  Filled 2021-10-12: qty 1

## 2021-10-12 MED ORDER — OXYCODONE HCL 5 MG PO TABS
ORAL_TABLET | ORAL | Status: AC
  Filled 2021-10-12: qty 1

## 2021-10-12 MED ORDER — OXYCODONE-ACETAMINOPHEN 5-325 MG PO TABS
1.0000 | ORAL_TABLET | ORAL | 0 refills | Status: AC | PRN
Start: 2021-10-12 — End: 2022-10-12

## 2021-10-12 MED ORDER — MORPHINE SULFATE (PF) 4 MG/ML IV SOLN
4.0000 mg | Freq: Once | INTRAVENOUS | Status: DC
  Filled 2021-10-12: qty 1

## 2021-10-12 MED ORDER — ONDANSETRON HCL 4 MG/2ML IJ SOLN
4.0000 mg | Freq: Once | INTRAMUSCULAR | Status: AC | PRN
  Administered 2021-10-12: 4 mg via INTRAVENOUS
  Filled 2021-10-12: qty 2

## 2021-10-12 MED ORDER — ONDANSETRON HCL 4 MG PO TABS: 4.0000 mg | ORAL_TABLET | Freq: Every day | ORAL | 1 refills | Status: AC | PRN

## 2021-10-12 MED ORDER — SUCCINYLCHOLINE CHLORIDE 200 MG/10ML IV SOSY
PREFILLED_SYRINGE | INTRAVENOUS | Status: AC
  Filled 2021-10-12: qty 10

## 2021-10-12 MED ORDER — BUPIVACAINE-EPINEPHRINE (PF) 0.25% -1:200000 IJ SOLN
INTRAMUSCULAR | Status: AC
  Filled 2021-10-12: qty 30

## 2021-10-12 MED ORDER — MIDAZOLAM HCL 2 MG/2ML IJ SOLN
INTRAMUSCULAR | Status: DC | PRN
  Administered 2021-10-12: 2 mg via INTRAVENOUS

## 2021-10-12 MED ORDER — FENTANYL CITRATE PF 50 MCG/ML IJ SOSY
50.0000 ug | PREFILLED_SYRINGE | Freq: Once | INTRAMUSCULAR | Status: AC
  Administered 2021-10-12: 50 ug via INTRAVENOUS
  Filled 2021-10-12: qty 1

## 2021-10-12 SURGICAL SUPPLY — 39 items
APPLICATOR ARISTA FLEXITIP XL (MISCELLANEOUS) ×1 IMPLANT
APPLIER CLIP ROT 10 11.4 M/L (STAPLE)
BAG COUNTER SPONGE SURGICOUNT (BAG) IMPLANT
CLIP APPLIE ROT 10 11.4 M/L (STAPLE) IMPLANT
CUTTER FLEX LINEAR 45M (STAPLE) IMPLANT
DECANTER SPIKE VIAL GLASS SM (MISCELLANEOUS) ×2 IMPLANT
DERMABOND ADVANCED (GAUZE/BANDAGES/DRESSINGS) ×1
DERMABOND ADVANCED .7 DNX12 (GAUZE/BANDAGES/DRESSINGS) ×1 IMPLANT
DRAPE LAPAROSCOPIC ABDOMINAL (DRAPES) ×2 IMPLANT
DRAPE WARM FLUID 44X44 (DRAPES) ×2 IMPLANT
ELECT REM PT RETURN 15FT ADLT (MISCELLANEOUS) ×2 IMPLANT
ENDOLOOP SUT PDS II  0 18 (SUTURE)
ENDOLOOP SUT PDS II 0 18 (SUTURE) IMPLANT
GLOVE SURG MICRO LTX SZ8 (GLOVE) ×2 IMPLANT
GLOVE SURG UNDER LTX SZ8 (GLOVE) ×4 IMPLANT
GLOVE SURG UNDER POLY LF SZ7 (GLOVE) ×2 IMPLANT
GOWN STRL REUS W/TWL LRG LVL3 (GOWN DISPOSABLE) ×2 IMPLANT
GOWN STRL REUS W/TWL XL LVL3 (GOWN DISPOSABLE) ×4 IMPLANT
HEMOSTAT ARISTA ABSORB 3G PWDR (HEMOSTASIS) ×1 IMPLANT
IRRIG SUCT STRYKERFLOW 2 WTIP (MISCELLANEOUS) ×2
IRRIGATION SUCT STRKRFLW 2 WTP (MISCELLANEOUS) ×1 IMPLANT
KIT BASIN OR (CUSTOM PROCEDURE TRAY) ×2 IMPLANT
KIT TURNOVER KIT A (KITS) IMPLANT
PENCIL SMOKE EVACUATOR (MISCELLANEOUS) IMPLANT
POUCH SPECIMEN RETRIEVAL 10MM (ENDOMECHANICALS) ×2 IMPLANT
RELOAD 45 VASCULAR/THIN (ENDOMECHANICALS) IMPLANT
RELOAD STAPLE 45 2.5 WHT GRN (ENDOMECHANICALS) IMPLANT
RELOAD STAPLE 45 3.5 BLU ETS (ENDOMECHANICALS) IMPLANT
RELOAD STAPLE TA45 3.5 REG BLU (ENDOMECHANICALS) ×2 IMPLANT
SET TUBE SMOKE EVAC HIGH FLOW (TUBING) ×2 IMPLANT
SHEARS HARMONIC ACE PLUS 36CM (ENDOMECHANICALS) IMPLANT
SLEEVE XCEL OPT CAN 5 100 (ENDOMECHANICALS) ×2 IMPLANT
SUT MNCRL AB 4-0 PS2 18 (SUTURE) ×2 IMPLANT
TOWEL OR 17X26 10 PK STRL BLUE (TOWEL DISPOSABLE) ×2 IMPLANT
TRAY FOLEY MTR SLVR 16FR STAT (SET/KITS/TRAYS/PACK) ×2 IMPLANT
TRAY LAPAROSCOPIC (CUSTOM PROCEDURE TRAY) ×2 IMPLANT
TROCAR BLADELESS OPT 5 100 (ENDOMECHANICALS) ×2 IMPLANT
TROCAR XCEL 12X100 BLDLESS (ENDOMECHANICALS) ×2 IMPLANT
TROCAR XCEL BLUNT TIP 100MML (ENDOMECHANICALS) ×2 IMPLANT

## 2021-10-12 NOTE — Anesthesia Procedure Notes (Signed)
Procedure Name: Intubation Date/Time: 10/12/2021 8:36 AM Performed by: Florene Route, CRNA Pre-anesthesia Checklist: Patient identified, Emergency Drugs available, Suction available and Patient being monitored Patient Re-evaluated:Patient Re-evaluated prior to induction Oxygen Delivery Method: Circle system utilized Preoxygenation: Pre-oxygenation with 100% oxygen Induction Type: IV induction, Rapid sequence and Cricoid Pressure applied Laryngoscope Size: Miller and 2 Grade View: Grade I Tube type: Oral Number of attempts: 1 Airway Equipment and Method: Stylet and Oral airway Placement Confirmation: ETT inserted through vocal cords under direct vision, positive ETCO2 and breath sounds checked- equal and bilateral Secured at: 21 cm Tube secured with: Tape Dental Injury: Teeth and Oropharynx as per pre-operative assessment

## 2021-10-12 NOTE — ED Provider Notes (Signed)
[facility] EMERGENCY DEPARTMENT Provider Note   CSN: [id number] Arrival date & time: 10/12/21  0304     History  Chief Complaint  Patient presents with   Abdominal Pain    Courtney Landry is a 51 y.o. female.  HPI     51 year old female comes in with chief complaint of abdominal pain. Patient is status postcholecystectomy and has history of gastric sleeve procedure.  She indicates 3-day history of abdominal pain that has intensified over time.  She has associated nausea and vomiting.  Abdominal pain is indicated to be periumbilical and epigastric.  No specific evoking, aggravating or relieving factors.  Pain started getting worse yesterday and woke her up in the middle the night.  She denies any associated fevers or chills.  BM have been normal.  Home Medications Prior to Admission medications   Medication Sig Start Date End Date Taking? Authorizing Provider  ALPRAZolam Prudy Feeler) 1 MG tablet Take 1 tablet by mouth 2 (two) times daily as needed. 08/17/16   [provider]  butalbital-acetaminophen-caffeine (FIORICET) 50-325-40 MG tablet TAKE 1-2 TABLETS BY MOUTH 3 TIMES DAILY AS NEEDED FOR HEADACHES. SCHEDULE APPT FOR FURTHER REFILLS 04/22/21   [provider]  Cholecalciferol 50 MCG (2000 UT) CAPS Take by mouth daily.    [provider]  cyanocobalamin 1000 MCG tablet Take by mouth daily.    [provider]  Fremanezumab-vfrm (AJOVY) 225 MG/1.5ML SOAJ Inject 225 mg into the skin every 30 (thirty) days. 07/14/21   Ocie Doyne, MD  HYDROmorphone (DILAUDID) 4 MG tablet Take 1 tablet (4 mg total) by mouth every 4 (four) hours as needed (for pain). 01/10/17   Molpus, John, MD  lamoTRIgine (LAMICTAL) 150 MG tablet One tab at 8 AM and one at bed time 10/10/19   [provider]  meloxicam (MOBIC) 7.5 MG tablet Take 7.5 mg by mouth daily. 08/21/21   [provider]  Multiple Vitamins-Minerals (THERA-M) TABS Take 1 tablet by  mouth daily.    [provider]  QUEtiapine (SEROQUEL) 25 MG tablet Take 25 mg by mouth at bedtime. 06/20/21   [provider]  tiZANidine (ZANAFLEX) 4 MG tablet Take 4 mg by mouth every 8 (eight) hours as needed. 08/21/21   [provider]  Vilazodone HCl (VIIBRYD) 40 MG TABS TAKE ONE TABLET BY MOUTH DAILY. TAKE WITH FOOD 10/11/19   [provider]      Allergies    Hydrocodone-acetaminophen and Tramadol    Review of Systems   Review of Systems  Constitutional:  Positive for activity change.  Gastrointestinal:  Positive for abdominal pain.   Physical Exam Updated Vital Signs BP 116/74    Pulse 79    Temp 97.9 F (36.6 C) (Oral)    Resp 16    Ht 5\' 3"  (1.6 m)    Wt 80.3 kg    SpO2 98%    BMI 31.35 kg/m  Physical Exam Vitals and nursing note reviewed.  Constitutional:      Appearance: She is well-developed.  HENT:     Head: Atraumatic.  Cardiovascular:     Rate and Rhythm: Normal rate.  Pulmonary:     Effort: Pulmonary effort is normal.  Abdominal:     Tenderness: There is abdominal tenderness in the right lower quadrant, epigastric area and periumbilical area. There is guarding. There is no rebound. Positive signs include McBurney's sign.  Musculoskeletal:     Cervical back: Normal range of motion and neck supple.  Skin:  General: Skin is warm and dry.  Neurological:     Mental Status: She is alert and oriented to person, place, and time.    ED Results / Procedures / Treatments   Labs (all labs ordered are listed, but only abnormal results are displayed) Labs Reviewed  CBC WITH DIFFERENTIAL/PLATELET - Abnormal; Notable for the following components:      Result Value   WBC 16.3 (*)    Neutro Abs 10.2 (*)    Eosinophils Absolute 1.1 (*)    All other components within normal limits  COMPREHENSIVE METABOLIC PANEL - Abnormal; Notable for the following components:   Glucose, Bld 104 (*)    All other components within normal limits  RESP  PANEL BY RT-PCR (FLU A&B, COVID) ARPGX2  LIPASE, BLOOD  URINALYSIS, ROUTINE W REFLEX MICROSCOPIC    EKG EKG Interpretation  Date/Time:  Sunday October 12 2021 03:18:53 EST Ventricular Rate:  80 PR Interval:  152 QRS Duration: 91 QT Interval:  382 QTC Calculation: 441 R Axis:   73 Text Interpretation: Sinus rhythm Low voltage, precordial leads No acute changes No significant change since last tracing Confirmed by Derwood KaplanNanavati, Khiem Gargis [phone]([id number]) on 10/12/2021 5:40:15 AM  Radiology CT Abdomen Pelvis W Contrast  Result Date: 10/12/2021 CLINICAL DATA:  Nausea, vomiting and right lower quadrant pain EXAM: CT ABDOMEN AND PELVIS WITH CONTRAST TECHNIQUE: Multidetector CT imaging of the abdomen and pelvis was performed using the standard protocol following bolus administration of intravenous contrast. RADIATION DOSE REDUCTION: This exam was performed according to the departmental dose-optimization program which includes automated exposure control, adjustment of the mA and/or kV according to patient size and/or use of iterative reconstruction technique. CONTRAST:  100mL OMNIPAQUE IOHEXOL 300 MG/ML  SOLN COMPARISON:  01/10/2017 FINDINGS: Lower chest: No acute abnormality. Hepatobiliary: No focal liver abnormality. Status post cholecystectomy. Mild intrahepatic bile duct dilatation. Common bile duct measures 7 mm in maximum diameter. No obstructing stone or mass noted. Pancreas: Unremarkable. No pancreatic ductal dilatation or surrounding inflammatory changes. Spleen: Normal in size without focal abnormality. Adrenals/Urinary Tract: Normal adrenal glands. Multiple left renal calculi. The largest is in the inferior pole measuring 4 mm. No hydronephrosis or mass identified bilaterally. Within the distal left ureter just before the UVJ there is a stone measuring 4 by 2 mm, image 43/5. This does not result in significant hydroureter or hydronephrosis. Stomach/Bowel: Postoperative changes involving the stomach. The  appendix is visualized arising off the base of cecum extending along the right pelvic sidewall. There are several calcified appendicoliths identified within the proximal and distal lumen of the appendix. The diameter of the appendix is increased measuring 1 cm (normal 6 mm or less), image 71/2. No significant no periappendiceal free fluid or fluid collection. No bowel wall thickening, inflammation, or distension. Vascular/Lymphatic: Aortic atherosclerosis. No enlarged abdominal or pelvic lymph nodes. Reproductive: Uterus and bilateral adnexa are unremarkable. Other: No free fluid or fluid collections. Musculoskeletal: No acute or significant osseous findings. IMPRESSION: 1. Abnormal increased caliber of the appendix with signs of mural edema. Multiple appendicoliths identified within the lumen of the appendix. Findings suspicious for acute appendicitis. No signs of perforation or abscess. 2. 4 mm stone identified within the distal left ureter just before lead UVJ. No significant left sided hydronephrosis or hydroureter. 3. Status post cholecystectomy with mild intrahepatic and extrahepatic bile duct dilatation. No obstructing stone or mass identified. 4. Aortic Atherosclerosis (ICD10-I70.0). Electronically Signed   By: Signa Kellaylor  Stroud M.D.   On: 10/12/2021 05:40    Procedures  Procedures    Medications Ordered in ED Medications  cefTRIAXone (ROCEPHIN) 2 g in sodium chloride 0.9 % 100 mL IVPB (2 g Intravenous New Bag/Given 10/12/21 0607)    And  metroNIDAZOLE (FLAGYL) IVPB 500 mg (has no administration in time range)  morphine 4 MG/ML injection 4 mg (has no administration in time range)  ondansetron (ZOFRAN) injection 4 mg (has no administration in time range)  HYDROmorphone (DILAUDID) injection 1 mg (1 mg Intravenous Given 10/12/21 0416)  ondansetron (ZOFRAN) injection 4 mg (4 mg Intravenous Given 10/12/21 0416)  lactated ringers bolus 1,000 mL (0 mLs Intravenous Stopped 10/12/21 0530)  iohexol (OMNIPAQUE)  300 MG/ML solution 100 mL (100 mLs Intravenous Contrast Given 10/12/21 0455)  fentaNYL (SUBLIMAZE) injection 50 mcg (50 mcg Intravenous Given 10/12/21 0514)  sodium chloride 0.9 % bolus 1,000 mL (1,000 mLs Intravenous New Bag/Given 10/12/21 0606)    ED Course/ Medical Decision Making/ A&P Clinical Course as of 10/12/21 [id number]  Wynelle LinkSun Oct 12, 2021  0611 CT Abdomen Pelvis W Contrast Pt made aware of the diagnosis and plan [facility].  [AN]    Clinical Course User Index [AN] Derwood KaplanNanavati, Dellie Piasecki, MD                           Medical Decision Making Amount and/or Complexity of Data Reviewed Labs: ordered. Radiology: ordered.  Risk Prescription drug management. Decision regarding hospitalization.   This patient presents to the ED with chief complaint(s) of abdominal pain for the last 3 days with associated nausea and vomit and pertinent past medical history of cholecystectomy and gastric sleeve procedure which further complicates the presenting complaint. The complaint involves an extensive differential diagnosis and treatment options and also carries with it a high risk of complications and morbidity.    On exam patient has positive McBurney's and also epigastric discomfort.   The differential diagnosis includes acute appendicitis, perforated viscus, intra-abdominal abscess, pancreatitis, gastritis.  The initial plan is to get basic labs and a CT abdomen and pelvis with contrast.  Initiate IV pain control with hydromorphone, IV Zofran for her nausea and IV fluids, keeping patient n.p.o.   Reassessment and review: Lab Tests: I Ordered, and personally interpreted labs.  The pertinent results include: CBC reveals leukocytosis, metabolic profile is normal.  Imaging Studies ordered: CT scan of the abdomen and pelvis with contrast with concerns for perforated viscus versus appendicitis.  CT scan confirms acute appendicitis without perforation.  Cardiac Monitoring: The patient was  maintained on a cardiac monitor.  I personally viewed and interpreted the cardiac monitor which showed an underlying rhythm of:  sinus rhythm  Medicines ordered and prescription drug management: I ordered the following medications IV hydromorphone followed by IV fentanyl for pain control and IV Zofran for her nausea. IV antibiotics for confirmed acute appendicitis   Reevaluation of the patient after these medicines showed that the patient    improved  Consultations Obtained: I requested consultation with the consultant , Dr. Carolynne Edouardoth, general surgery , and discussed  findings as well as pertinent plan - they recommend: [facility]  Complexity of problems addressed: Patients presentation is most consistent with  acute illness/injury with systemic symptoms During patient's assessment  Disposition: After consideration of the diagnostic results and the patients response to treatment,  Patient [facility], appendectomy.  Final Clinical Impression(s) / ED Diagnoses Final diagnoses:  Acute appendicitis with localized  peritonitis, without perforation, abscess, or gangrene    Rx / DC Orders ED Discharge Orders     None         Derwood Kaplan, MD 10/12/21 [phone]

## 2021-10-12 NOTE — Discharge Instructions (Signed)
[facility] ______[facility] LAPAROSCOPIC SURGERY: POST OP INSTRUCTIONS Always review your discharge instruction sheet given to you by the facility where your surgery was performed. IF YOU HAVE DISABILITY OR FAMILY LEAVE FORMS, YOU MUST BRING THEM TO THE OFFICE FOR PROCESSING.   DO NOT GIVE THEM TO YOUR DOCTOR.  A prescription for pain medication may be given to you upon discharge.  Take your pain medication as prescribed, if needed.  If narcotic pain medicine is not needed, then you may take acetaminophen (Tylenol) or ibuprofen (Advil) as needed. Take your usually prescribed medications unless otherwise directed. If you need a refill on your pain medication, please contact your pharmacy.  They will contact our office to request authorization. Prescriptions will not be filled after 5pm or on week-ends. You should follow a light diet the first few days after arrival home, such as soup and crackers, etc.  Be sure to include lots of fluids daily. Most patients will experience some swelling and bruising in the area of the incisions.  Ice packs will help.  Swelling and bruising can take several days to resolve.  It is common to experience some constipation if taking pain medication after surgery.  Increasing fluid intake and taking a stool softener (such as Colace) will usually help or prevent this problem from occurring.  A mild laxative (Milk of Magnesia or Miralax) should be taken according to package instructions if there are no bowel movements after 48 hours. Unless discharge instructions indicate otherwise, you may remove your bandages 24-48 hours after surgery, and you may shower at that time.  You may have steri-strips (small skin tapes) in place directly over the incision.  These strips should be left on the skin for 7-10 days.  If your surgeon used skin glue on the incision, you may shower in 24 hours.  The glue will flake off over the next 2-3 weeks.  Any sutures or staples will be  removed at the office during your follow-up visit. ACTIVITIES:  You may resume regular (light) daily activities beginning the next day--such as daily self-care, walking, climbing stairs--gradually increasing activities as tolerated.  You may have sexual intercourse when it is comfortable.  Refrain from any heavy lifting or straining until approved by your doctor. You may drive when you are no longer taking prescription pain medication, you can comfortably wear a seatbelt, and you can safely maneuver your car and apply brakes. RETURN TO WORK:  ______10 - 14 days ____________________________________________________ You should see your doctor in the office for a follow-up appointment approximately 2-3 weeks after your surgery.  Make sure that you call for this appointment within a day or two after you arrive home to insure a convenient appointment time. OTHER INSTRUCTIONS: __________________________________________________________________________________________________________________________ __________________________________________________________________________________________________________________________ WHEN TO CALL YOUR DOCTOR: Fever over 101.0 Inability to urinate Continued bleeding from incision. Increased pain, redness, or drainage from the incision. Increasing abdominal pain  The clinic staff is available to answer your questions during regular business hours.  Please don't hesitate to call and ask to speak to one of the nurses for clinical concerns.  If you have a medical emergency, go to the nearest emergency room or call 911.  A surgeon from [facility] is always on call at the hospital. [address], [address], [place], [place]  [address] ? [address] [address], [place], [place]   [address] [phone] ? [phone] ? FAX [phone] Web site: [email or web address]  

## 2021-10-12 NOTE — Op Note (Signed)
Preoperative diagnosis: Acute appendicitis  Postoperative diagnosis: Same  Procedure: Laparoscopic appendectomy  Surgeon: Erroll Luna, MD  Anesthesia: General with 0.25% Marcaine plain  EBL: 10 cc  Specimen: Appendix to pathology  Drains: None  IV fluids: Per anesthesia record  Indications for procedure: The patient is a 51 year old female who presented to the emergency room yesterday with acute appendicitis.  She was transferred to [facility] long hospital for definitive surgical care.  Risks and benefits of surgery were discussed as well as medical management options.  Given the fact she had appendicoliths on her CT scan she was not an appropriate nonoperative management candidate.  Examination showed tenderness in the right lower quadrant.  I recommended laparoscopic appendectomy for acute appendicitis.  This was verified by CT scan and exam.  She agreed to proceed. The procedure has been discussed with the patient.  Alternative therapies have been discussed with the patient.  Operative risks include bleeding,  Infection,  Organ injury,  Nerve injury,  Blood vessel injury,  DVT,  Pulmonary embolism,  Death,  And possible reoperation.  Medical management risks include worsening of present situation.  The success of the procedure is 50 -90 % at treating patients symptoms.  The patient understands and agrees to proceed.     Description of procedure: The patient was met in the holding area and questions were answered.  I reviewed her CT scan with her as well as findings she agreed to proceed to surgery.  Risks and benefits were discussed again as well.  She was taken back to the operative room.  She is placed supine upon the OR table.  After induction of general anesthesia, left arm was tucked and right arm was placed in an armboard.  Her abdomen was then prepped and draped in a sterile fashion and timeout was performed.  She received appropriate preoperative antibiotics.  A 1 cm supraumbilical  incision was made.  Dissection was carried down to the fascia and the fascia was opened in the midline without difficulty.  A pursestring suture of 0 Vicryl was placed and a 12 mm Hassan cannula was placed under direct vision.  Pneumoperitoneum was created to 15 mmHg of CO2 pressure.  Laparoscopy was performed.  She had evidence of a cholecystectomy and a gastric sleeve.  There were very few intra-abdominal adhesions noted.  I then placed a 5 mm right upper quadrant port and a left lower quadrant port which was as well a 5 mm port.  Patient was placed in Trendelenburg and rolled her left.  The appendix was long but could be seen in the pelvis and pulled out.  There were signs of acute appendicitis which was early.  There is also signs of appendicoliths.  The mesoappendix was taken down with harmonic scalpel with excellent hemostasis.  Once I got to the base a GIA 45 stapler was used and fired across the base.  The appendix was then placed into an Endo Catch bag and extracted through the umbilical port site and passed off the field.  The stump was examined.  There were some oozing and Arista powder was placed on this.  The mesoappendix was also found to be hemostatic.  I then took her out of Trendelenburg.  Four-quadrant laparoscopy revealed no other abnormality nor injury from insertion of the trochars or the procedure.  They were all removed allow the CO2 to escape.  There is no signs of port site bleeding.  We then closed the fascia with 0 Vicryl.  Skin closed  with 4-0 Monocryl.  Dermabond applied.  All counts found to be correct.  The patient was awoke extubated taken to recovery in satisfactory condition.

## 2021-10-12 NOTE — H&P (Addendum)
Courtney HarriesRebecca Landry is an 51 y.o. female.   Chief Complaint: Right lower quadrant abdominal pain for 1 day HPI: [facility] as [facility] after being seen and evaluated and found to have acute appendicitis by examination, history and CT scan.  She complains of a 1 day history of lower abdominal pain mostly in the right lower quadrant.  The pain radiates to her back.  The pain is sharp in nature made worse with movement and is a 7-8 out of 10.  No associated nausea or vomiting.  CT scan shows a thickened appendix with appendicoliths consistent with acute appendicitis without evidence of perforation.  Denies fever or chills.  She has fatigue.  History of gastric sleeve and laparoscopic cholecystectomy.  Past Medical History:  Diagnosis Date   Kidney stones    Major depressive disorder    Migraine    Palpitation    UTI (urinary tract infection)     Past Surgical History:  Procedure Laterality Date   APPENDECTOMY     CESAREAN SECTION     CHOLECYSTECTOMY     TUBAL LIGATION      Family History  Problem Relation Age of Onset   Migraines Mother    Macular degeneration Mother    High Cholesterol Mother    Pulmonary embolism Father    Social History:  reports that she has been smoking cigarettes. She has been smoking an average of 1 pack per day. She has never used smokeless tobacco. She reports current alcohol use. She reports that she does not use drugs.  Allergies:  Allergies  Allergen Reactions   Hydrocodone-Acetaminophen Nausea And Vomiting   Tramadol Nausea Only and Nausea And Vomiting    And headache     (Not in a hospital admission)   Results for orders placed or performed during the hospital encounter of 10/12/21 (from the past 48 hour(s))  CBC with Differential     Status: Abnormal   Collection Time: 10/12/21  4:21 AM  Result Value Ref Range   WBC 16.3 (H) 4.0 - 10.5 K/uL   RBC 4.07 3.87 - 5.11 MIL/uL   Hemoglobin 12.3 12.0 -  15.0 g/dL   HCT 40.936.2 81.136.0 - 91.446.0 %   MCV 88.9 80.0 - 100.0 fL   MCH 30.2 26.0 - 34.0 pg   MCHC 34.0 30.0 - 36.0 g/dL   RDW 78.213.9 95.611.5 - 21.315.5 %   Platelets 342 150 - 400 K/uL   nRBC 0.0 0.0 - 0.2 %   Neutrophils Relative % 62 %   Neutro Abs 10.2 (H) 1.7 - 7.7 K/uL   Lymphocytes Relative 25 %   Lymphs Abs 4.0 0.7 - 4.0 K/uL   Monocytes Relative 5 %   Monocytes Absolute 0.8 0.1 - 1.0 K/uL   Eosinophils Relative 7 %   Eosinophils Absolute 1.1 (H) 0.0 - 0.5 K/uL   Basophils Relative 1 %   Basophils Absolute 0.1 0.0 - 0.1 K/uL   Immature Granulocytes 0 %   Abs Immature Granulocytes 0.05 0.00 - 0.07 K/uL    Comment: Performed at [facility] [place], [address] [place], [place] [address]  Comprehensive metabolic panel     Status: Abnormal   Collection Time: 10/12/21  4:21 AM  Result Value Ref Range   Sodium 138 135 - 145 mmol/L   Potassium 3.6 3.5 - 5.1 mmol/L   Chloride 103 98 - 111 mmol/L   CO2 25 22 - 32 mmol/L  Glucose, Bld 104 (H) 70 - 99 mg/dL    Comment: Glucose reference range applies only to samples taken after fasting for at least 8 hours.   BUN 11 6 - 20 mg/dL   Creatinine, Ser 2.44 0.44 - 1.00 mg/dL   Calcium 9.6 8.9 - 01.0 mg/dL   Total Protein 6.8 6.5 - 8.1 g/dL   Albumin 3.8 3.5 - 5.0 g/dL   AST 19 15 - 41 U/L   ALT 19 0 - 44 U/L   Alkaline Phosphatase 67 38 - 126 U/L   Total Bilirubin 0.7 0.3 - 1.2 mg/dL   GFR, Estimated >27 >25 mL/min    Comment: (NOTE) Calculated using the CKD-EPI Creatinine Equation (2021)    Anion gap 10 5 - 15    Comment: Performed at [facility], [address] [place], [place] [address]  Lipase, blood     Status: None   Collection Time: 10/12/21  4:21 AM  Result Value Ref Range   Lipase 37 11 - 51 U/L    Comment: Performed at [facility], [address] [place], [place] [address]  Urinalysis, Routine w reflex microscopic     Status: Abnormal   Collection Time: 10/12/21  4:21 AM  Result Value Ref  Range   Color, Urine YELLOW YELLOW   APPearance CLEAR CLEAR   Specific Gravity, Urine 1.010 1.005 - 1.030   pH 5.5 5.0 - 8.0   Glucose, UA NEGATIVE NEGATIVE mg/dL   Hgb urine dipstick NEGATIVE NEGATIVE   Bilirubin Urine NEGATIVE NEGATIVE   Ketones, ur NEGATIVE NEGATIVE mg/dL   Protein, ur NEGATIVE NEGATIVE mg/dL   Nitrite NEGATIVE NEGATIVE   Leukocytes,Ua SMALL (A) NEGATIVE    Comment: Performed at [facility], [address] [place], [place] [address]  Urinalysis, Microscopic (reflex)     Status: Abnormal   Collection Time: 10/12/21  4:21 AM  Result Value Ref Range   RBC / HPF 0-5 0 - 5 RBC/hpf   WBC, UA 6-10 0 - 5 WBC/hpf   Bacteria, UA FEW (A) NONE SEEN   Squamous Epithelial / LPF 0-5 0 - 5    Comment: Performed at [facility], [address] [place], [place] [address]  Resp Panel by RT-PCR (Flu A&B, Covid) Nasopharyngeal Swab     Status: None   Collection Time: 10/12/21  6:08 AM   Specimen: Nasopharyngeal Swab; Nasopharyngeal(NP) swabs in vial transport medium  Result Value Ref Range   SARS Coronavirus 2 by RT PCR NEGATIVE NEGATIVE    Comment: (NOTE) SARS-CoV-2 target nucleic acids are NOT DETECTED.  The SARS-CoV-2 RNA is generally detectable in upper respiratory specimens during the acute phase of infection. The lowest concentration of SARS-CoV-2 viral copies this assay can detect is 138 copies/mL. A negative result does not preclude SARS-Cov-2 infection and should not be used as the sole basis for treatment or other patient management decisions. A negative result may occur with  improper specimen collection/handling, submission of specimen other than nasopharyngeal swab, presence of viral mutation(s) within the areas targeted by this assay, and inadequate number of viral copies(<138 copies/mL). A negative result must be combined with clinical observations, patient history, and epidemiological information. The expected result is Negative.  Fact  Sheet for Patients:  BloggerCourse.com  Fact Sheet for Healthcare Providers:  SeriousBroker.it  This test is no t yet approved or cleared by the [place] FDA and  has been authorized for detection  and/or diagnosis of SARS-CoV-2 by FDA under an Emergency Use Authorization (EUA). This EUA will remain  in effect (meaning this test can be used) for the duration of the COVID-19 declaration under Section 564(b)(1) of the Act, 21 U.S.C.section 360bbb-3(b)(1), unless the authorization is terminated  or revoked sooner.       Influenza A by PCR NEGATIVE NEGATIVE   Influenza B by PCR NEGATIVE NEGATIVE    Comment: (NOTE) The Xpert Xpress SARS-CoV-2/FLU/RSV plus assay is intended as an aid in the diagnosis of influenza from Nasopharyngeal swab specimens and should not be used as a sole basis for treatment. Nasal washings and aspirates are unacceptable for Xpert Xpress SARS-CoV-2/FLU/RSV testing.  Fact Sheet for Patients: BloggerCourse.com  Fact Sheet for Healthcare Providers: SeriousBroker.it  This test is not yet approved or cleared by the [place] FDA and has been authorized for detection and/or diagnosis of SARS-CoV-2 by FDA under an Emergency Use Authorization (EUA). This EUA will remain in effect (meaning this test can be used) for the duration of the COVID-19 declaration under Section 564(b)(1) of the Act, 21 U.S.C. section 360bbb-3(b)(1), unless the authorization is terminated or revoked.  Performed at [facility], [address] [place], [place] [address]    CT Abdomen Pelvis W Contrast  Result Date: 10/12/2021 CLINICAL DATA:  Nausea, vomiting and right lower quadrant pain EXAM: CT ABDOMEN AND PELVIS WITH CONTRAST TECHNIQUE: Multidetector CT imaging of the abdomen and pelvis was performed using the standard protocol following bolus administration of  intravenous contrast. RADIATION DOSE REDUCTION: This exam was performed according to the departmental dose-optimization program which includes automated exposure control, adjustment of the mA and/or kV according to patient size and/or use of iterative reconstruction technique. CONTRAST:  OMNIPAQUE IOHEXOL 300 MG/ML  SOLN COMPARISON:  01/10/2017 FINDINGS: Lower chest: No acute abnormality. Hepatobiliary: No focal liver abnormality. Status post cholecystectomy. Mild intrahepatic bile duct dilatation. Common bile duct measures 7 mm in maximum diameter. No obstructing stone or mass noted. Pancreas: Unremarkable. No pancreatic ductal dilatation or surrounding inflammatory changes. Spleen: Normal in size without focal abnormality. Adrenals/Urinary Tract: Normal adrenal glands. Multiple left renal calculi. The largest is in the inferior pole measuring 4 mm. No hydronephrosis or mass identified bilaterally. Within the distal left ureter just before the UVJ there is a stone measuring 4 by 2 mm, image 43/5. This does not result in significant hydroureter or hydronephrosis. Stomach/Bowel: Postoperative changes involving the stomach. The appendix is visualized arising off the base of cecum extending along the right pelvic sidewall. There are several calcified appendicoliths identified within the proximal and distal lumen of the appendix. The diameter of the appendix is increased measuring 1 cm (normal 6 mm or less), image 71/2. No significant no periappendiceal free fluid or fluid collection. No bowel wall thickening, inflammation, or distension. Vascular/Lymphatic: Aortic atherosclerosis. No enlarged abdominal or pelvic lymph nodes. Reproductive: Uterus and bilateral adnexa are unremarkable. Other: No free fluid or fluid collections. Musculoskeletal: No acute or significant osseous findings. IMPRESSION: 1. Abnormal increased caliber of the appendix with signs of mural edema. Multiple appendicoliths identified within the  lumen of the appendix. Findings suspicious for acute appendicitis. No signs of perforation or abscess. 2. 4 mm stone identified within the distal left ureter just before lead UVJ. No significant left sided hydronephrosis or hydroureter. 3. Status post cholecystectomy with mild intrahepatic and extrahepatic bile duct dilatation. No obstructing stone or mass identified. 4. Aortic Atherosclerosis (ICD10-I70.0). Electronically Signed   By: Ladona Ridgel  Bradly ChrisStroud M.D.   On: 10/12/2021 05:40    Review of Systems  Constitutional:  Positive for fatigue. Negative for fever.  Gastrointestinal:  Positive for abdominal pain. Negative for abdominal distention.  All other systems reviewed and are negative.  Blood pressure 120/63, pulse 97, temperature 99.1 F (37.3 C), temperature source Oral, resp. rate 19, height 5\' 3"  (1.6 m), weight 80.3 kg, SpO2 99 %. Physical Exam Constitutional:      Appearance: She is well-developed.  HENT:     Head: Normocephalic.  Eyes:     General: No scleral icterus.    Extraocular Movements: Extraocular movements intact.  Pulmonary:     Effort: Pulmonary effort is normal.     Breath sounds: No stridor.  Abdominal:     Tenderness: There is abdominal tenderness in the right lower quadrant. Positive signs include McBurney's sign.     Hernia: No hernia is present.    Skin:    General: Skin is warm and dry.  Neurological:     General: No focal deficit present.     Mental Status: She is alert.  Psychiatric:        Mood and Affect: Mood normal.        Behavior: Behavior normal.     Assessment/Plan Acute appendicitis  We discussed medical and surgical options of treatment of acute appendicitis.  We discussed antibiotic treatment versus appendectomy.  With appendicoliths on her CT scan, she is not a good candidate for medical management therefore recommend laparoscopic appendectomy to her.The procedure has been discussed with the patient.  Alternative therapies have been  discussed with the patient.  Operative risks include bleeding,  Infection,  Organ injury,  Nerve injury,  Blood vessel injury,  DVT,  Pulmonary embolism,  Death,  And possible reoperation.  Medical management risks include worsening of present situation.  The success of the procedure is 50 -90 % at treating patients symptoms.  The patient understands and agrees to proceed.     Total time 1 hour of face-to-face time, documentation, chart review, discussion of surgery, discussion of complications, care coordination.  Dortha Schwalbehomas A Laken Lobato, MD 10/12/2021, 7:52 AM

## 2021-10-12 NOTE — ED Triage Notes (Signed)
Reports epigastric pain with n/v X 3 since yesterday afternoon.  Reports some relief with ibuprofen but pain started back up.

## 2021-10-12 NOTE — Anesthesia Preprocedure Evaluation (Addendum)
Anesthesia Evaluation  Patient identified by MRN, date of birth, ID band Patient awake    Reviewed: Allergy & Precautions, H&P , NPO status , Patient's Chart, lab work & pertinent test results  Airway Mallampati: I  TM Distance: >3 FB Neck ROM: Full    Dental no notable dental hx. (+) Teeth Intact, Dental Advisory Given   Pulmonary Current Smoker and Patient abstained from smoking.,    Pulmonary exam normal breath sounds clear to auscultation       Cardiovascular negative cardio ROS   Rhythm:Regular Rate:Normal     Neuro/Psych  Headaches, Anxiety Depression    GI/Hepatic negative GI ROS, Neg liver ROS,   Endo/Other  negative endocrine ROS  Renal/GU negative Renal ROS  negative genitourinary   Musculoskeletal   Abdominal   Peds  Hematology negative hematology ROS (+)   Anesthesia Other Findings   Reproductive/Obstetrics negative OB ROS                            Anesthesia Physical Anesthesia Plan  ASA: 2 and emergent  Anesthesia Plan: General   Post-op Pain Management: Toradol IV (intra-op) and Ofirmev IV (intra-op)   Induction: Intravenous, Rapid sequence and Cricoid pressure planned  PONV Risk Score and Plan: 3 and Ondansetron, Dexamethasone and Midazolam  Airway Management Planned: Oral ETT  Additional Equipment:   Intra-op Plan:   Post-operative Plan: Extubation in OR  Informed Consent: I have reviewed the patients History and Physical, chart, labs and discussed the procedure including the risks, benefits and alternatives for the proposed anesthesia with the patient or authorized representative who has indicated his/her understanding and acceptance.     Dental advisory given  Plan Discussed with: CRNA  Anesthesia Plan Comments:         Anesthesia Quick Evaluation

## 2021-10-12 NOTE — ED Notes (Signed)
[facility] present for pt transport to [facility] ED.  

## 2021-10-12 NOTE — ED Provider Notes (Signed)
Patient transferred from [facility] to [place].  Briefly her HPI is significant for, 3 days of abdominal pain with nausea and vomiting.  Pain is periumbilical and epigastric.  Physical Exam  BP 120/65    Pulse 95    Temp 97.9 F (36.6 C) (Oral)    Resp 18    Ht 5\' 3"  (1.6 m)    Wt 80.3 kg    SpO2 99%    BMI 31.35 kg/m   Physical Exam Vitals and nursing note reviewed.  Constitutional:      General: She is not in acute distress.    Appearance: She is not ill-appearing, toxic-appearing or diaphoretic.  HENT:     Head: Normocephalic.  Eyes:     General: No scleral icterus.       Right eye: No discharge.        Left eye: No discharge.  Cardiovascular:     Rate and Rhythm: Normal rate.  Pulmonary:     Effort: Pulmonary effort is normal.  Abdominal:     General: Abdomen is flat. There is no distension. There are no signs of injury.     Palpations: Abdomen is soft. There is no mass or pulsatile mass.     Tenderness: There is abdominal tenderness in the right lower quadrant, epigastric area and periumbilical area. There is guarding. There is no rebound. Positive signs include McBurney's sign.     Hernia: There is no hernia in the umbilical area or ventral area.  Skin:    General: Skin is warm and dry.  Neurological:     General: No focal deficit present.     Mental Status: She is alert.  Psychiatric:        Behavior: Behavior is cooperative.    Procedures  Procedures  ED Course / MDM   Clinical Course as of 10/12/21 0717  10/14/21 Oct 12, 2021  0611 CT Abdomen Pelvis W Contrast Pt made aware of the diagnosis and plan to transfer to [facility] ED.  [AN]    Clinical Course User Index [AN] [facility], MD   Medical Decision Making Amount and/or Complexity of Data Reviewed Labs: ordered. Radiology: ordered. Decision-making details documented in ED Course.  Risk Prescription drug management. Decision regarding hospitalization.   Patient had CT abdomen pelvis which  showed findings suspicious for acute appendicitis.  Previous provider Dr. Derwood Kaplan spoke to Dr. Rhunette Croft with general surgery.  Patient was transferred to [facility] for surgical treatment.  Patient reports increased pain to abdomen after transport.  Abdomen remains soft, and nondistended at this time.  Will place additional pain medication for patient.  Will speak with general surgery to let them know patient has arrived in the emergency department.  0730 spoke to Dr. [facility] and informed that patient had arrived in the emergency department.  Patient was admitted and moved off the floor.       Luisa Hart, PA-C 10/12/21 10/14/21    [id number], MD 10/15/21 [phone]

## 2021-10-12 NOTE — Anesthesia Postprocedure Evaluation (Signed)
Anesthesia Post Note  Patient: Courtney Landry  Procedure(s) Performed: APPENDECTOMY LAPAROSCOPIC (Abdomen)     Patient location during evaluation: PACU Anesthesia Type: General Level of consciousness: awake and alert Pain management: pain level controlled Vital Signs Assessment: post-procedure vital signs reviewed and stable Respiratory status: spontaneous breathing, nonlabored ventilation and respiratory function stable Cardiovascular status: blood pressure returned to baseline and stable Postop Assessment: no apparent nausea or vomiting Anesthetic complications: no   No notable events documented.  Last Vitals:  Vitals:   10/12/21 1010 10/12/21 1016  BP:  112/67  Pulse: 98 (!) 102  Resp: 20   Temp:  36.4 C  SpO2: 97% 97%    Last Pain:  Vitals:   10/12/21 1016  TempSrc:   PainSc: 4                  Yael Angerer,W. EDMOND

## 2021-10-12 NOTE — Interval H&P Note (Signed)
History and Physical Interval Note:  10/12/2021 8:29 AM  Courtney Landry  has presented today for surgery, with the diagnosis of APPENDICITIS.  The various methods of treatment have been discussed with the patient and family. After consideration of risks, benefits and other options for treatment, the patient has consented to  Procedure(s): APPENDECTOMY LAPAROSCOPIC (N/A) as a surgical intervention.  The patient's history has been reviewed, patient examined, no change in status, stable for surgery.  I have reviewed the patient's chart and labs.  Questions were answered to the patient's satisfaction.     Shishir Krantz A Jenae Tomasello

## 2021-10-12 NOTE — Transfer of Care (Signed)
Immediate Anesthesia Transfer of Care Note  Patient: Courtney Landry  Procedure(s) Performed: APPENDECTOMY LAPAROSCOPIC (Abdomen)  Patient Location: PACU  Anesthesia Type:General  Level of Consciousness: awake  Airway & Oxygen Therapy: Patient Spontanous Breathing and Patient connected to face mask oxygen  Post-op Assessment: Report given to RN and Post -op Vital signs reviewed and stable  Post vital signs: Reviewed and stable  Last Vitals:  Vitals Value Taken Time  BP 133/73 10/12/21 0930  Temp 37.6 C 10/12/21 0930  Pulse 104 10/12/21 0931  Resp 27 10/12/21 0931  SpO2 99 % 10/12/21 0931  Vitals shown include unvalidated device data.  Last Pain:  Vitals:   10/12/21 0728  TempSrc:   PainSc: 6          Complications: No notable events documented.

## 2021-10-13 ENCOUNTER — Encounter (HOSPITAL_COMMUNITY): Payer: Self-pay | Admitting: Surgery

## 2021-10-14 ENCOUNTER — Telehealth: Payer: Self-pay | Admitting: Psychiatry

## 2021-10-14 LAB — SURGICAL PATHOLOGY

## 2021-10-14 NOTE — Telephone Encounter (Signed)
spoke to the patient she just had surgery and wcb to schedule [facility] auth: NPR Ref # [id number]

## 2021-10-19 ENCOUNTER — Other Ambulatory Visit: Payer: Self-pay | Admitting: Psychiatry

## 2021-10-19 DIAGNOSIS — G43709 Chronic migraine without aura, not intractable, without status migrainosus: Secondary | ICD-10-CM

## 2021-10-22 ENCOUNTER — Encounter: Payer: Self-pay | Admitting: Psychiatry

## 2021-10-23 ENCOUNTER — Other Ambulatory Visit: Payer: Self-pay | Admitting: Psychiatry

## 2021-10-23 MED ORDER — GABAPENTIN 300 MG PO CAPS: ORAL_CAPSULE | ORAL | 3 refills | Status: AC

## 2021-11-14 ENCOUNTER — Ambulatory Visit (HOSPITAL_BASED_OUTPATIENT_CLINIC_OR_DEPARTMENT_OTHER)
Admission: RE | Admit: 2021-11-14 | Discharge: 2021-11-14 | Disposition: A | Discharge status: Home / Self Care | Payer: No Typology Code available for payment source | Source: Ambulatory Visit | Attending: Student | Admitting: Student

## 2021-11-14 ENCOUNTER — Other Ambulatory Visit: Payer: Self-pay | Admitting: Student

## 2021-11-14 ENCOUNTER — Other Ambulatory Visit: Payer: Self-pay

## 2021-11-14 ENCOUNTER — Other Ambulatory Visit (HOSPITAL_COMMUNITY): Payer: Self-pay | Admitting: Student

## 2021-11-14 DIAGNOSIS — G8918 Other acute postprocedural pain: Secondary | ICD-10-CM | POA: Insufficient documentation

## 2021-11-14 LAB — POCT I-STAT CREATININE: Creatinine, Ser: 0.9 mg/dL (ref 0.44–1.00)

## 2021-11-14 MED ORDER — IOHEXOL 300 MG/ML  SOLN
100.0000 mL | Freq: Once | INTRAMUSCULAR | Status: AC | PRN
  Administered 2021-11-14: 100 mL via INTRAVENOUS

## 2021-11-15 ENCOUNTER — Encounter (HOSPITAL_COMMUNITY): Payer: Self-pay

## 2021-11-15 ENCOUNTER — Ambulatory Visit (HOSPITAL_COMMUNITY): Payer: No Typology Code available for payment source

## 2021-11-17 ENCOUNTER — Emergency Department (HOSPITAL_BASED_OUTPATIENT_CLINIC_OR_DEPARTMENT_OTHER)
Admission: EM | Admit: 2021-11-17 | Discharge: 2021-11-17 | Disposition: A | Discharge status: Home / Self Care | Payer: No Typology Code available for payment source | Attending: Emergency Medicine | Admitting: Emergency Medicine

## 2021-11-17 ENCOUNTER — Other Ambulatory Visit: Payer: Self-pay

## 2021-11-17 ENCOUNTER — Encounter (HOSPITAL_BASED_OUTPATIENT_CLINIC_OR_DEPARTMENT_OTHER): Payer: Self-pay

## 2021-11-17 DIAGNOSIS — R1110 Vomiting, unspecified: Secondary | ICD-10-CM | POA: Diagnosis not present

## 2021-11-17 DIAGNOSIS — R1010 Upper abdominal pain, unspecified: Secondary | ICD-10-CM

## 2021-11-17 DIAGNOSIS — R109 Unspecified abdominal pain: Secondary | ICD-10-CM | POA: Diagnosis present

## 2021-11-17 DIAGNOSIS — K5900 Constipation, unspecified: Secondary | ICD-10-CM | POA: Diagnosis not present

## 2021-11-17 LAB — COMPREHENSIVE METABOLIC PANEL
ALT: 12 U/L (ref 0–44)
AST: 12 U/L — ABNORMAL LOW (ref 15–41)
Albumin: 4.5 g/dL (ref 3.5–5.0)
Alkaline Phosphatase: 56 U/L (ref 38–126)
Anion gap: 8 (ref 5–15)
BUN: 15 mg/dL (ref 6–20)
CO2: 28 mmol/L (ref 22–32)
Calcium: 10.1 mg/dL (ref 8.9–10.3)
Chloride: 102 mmol/L (ref 98–111)
Creatinine, Ser: 0.86 mg/dL (ref 0.44–1.00)
GFR, Estimated: >60 mL/min (ref >60)
Glucose, Bld: 88 mg/dL (ref 70–99)
Potassium: 3.9 mmol/L (ref 3.5–5.1)
Sodium: 138 mmol/L (ref 135–145)
Total Bilirubin: 0.6 mg/dL (ref 0.3–1.2)
Total Protein: 7.6 g/dL (ref 6.5–8.1)

## 2021-11-17 LAB — CBC
HCT: 39.1 % (ref 36.0–46.0)
Hemoglobin: 12.8 g/dL (ref 12.0–15.0)
MCH: 28.8 pg (ref 26.0–34.0)
MCHC: 32.7 g/dL (ref 30.0–36.0)
MCV: 88.1 fL (ref 80.0–100.0)
Platelets: 371 10*3/uL (ref 150–400)
RBC: 4.44 MIL/uL (ref 3.87–5.11)
RDW: 13.9 % (ref 11.5–15.5)
WBC: 16.8 10*3/uL — ABNORMAL HIGH (ref 4.0–10.5)
nRBC: 0 % (ref 0.0–0.2)

## 2021-11-17 LAB — URINALYSIS, ROUTINE W REFLEX MICROSCOPIC
Bilirubin Urine: NEGATIVE
Glucose, UA: NEGATIVE mg/dL
Hgb urine dipstick: NEGATIVE
Ketones, ur: NEGATIVE mg/dL
Nitrite: NEGATIVE
Protein, ur: NEGATIVE mg/dL
Specific Gravity, Urine: 1.005 (ref 1.005–1.030)
pH: 6.5 (ref 5.0–8.0)

## 2021-11-17 LAB — LIPASE, BLOOD: Lipase: 41 U/L (ref 11–51)

## 2021-11-17 MED ORDER — SORBITOL 70 % SOLN: 960.0000 mL | TOPICAL_OIL | Freq: Once | ORAL | Status: DC

## 2021-11-17 MED ORDER — FLEET ENEMA 7-19 GM/118ML RE ENEM
1.0000 | ENEMA | Freq: Once | RECTAL | Status: AC
  Administered 2021-11-17: 1 via RECTAL
  Filled 2021-11-17: qty 1

## 2021-11-17 NOTE — Discharge Instructions (Signed)
You [facility] [place] the [facility] today to be evaluated for your constipation and abdominal distention.  Your physical exam and lab work were reassuring.  The CT scan obtained yesterday did not show any signs of a small bowel obstruction.  Please continue to take the medications prescribed for your constipation.  Please follow-up with [facility] surgery at your appointment tomorrow.  Get help right away if: Your pain does not go away as soon as your health care provider told you to expect. You cannot stop vomiting. Your pain is only in areas of the abdomen, such as the right side or the left lower portion of the abdomen. Pain on the right side could be caused by appendicitis. You have bloody or black stools, or stools that look like tar. You have severe pain, cramping, or bloating in your abdomen. You have signs of dehydration, such as: Dark urine, very little urine, or no urine. Cracked lips. Dry mouth. Sunken eyes. Sleepiness. Weakness. You have trouble breathing or chest pain.

## 2021-11-17 NOTE — ED Triage Notes (Signed)
Patient here POV from Home with ABD Pain.  Patient was seen and treated for Appendicitis Mid/Late January. Since then the Patient has has been having a few complications.  Endorses ABD Pain/Bloating more recently. Sent by General Surgery for Assessment.   Moderate N/V. No Diarrhea. Does endorse Constipation. No Fevers.   A&Ox4. GCS 15. Ambulatory. NAD Noted during Triage.

## 2021-11-17 NOTE — ED Provider Notes (Signed)
[name] Provider Note   CSN: [id number] Arrival date & time: 11/17/21  1604     History  Chief Complaint  Patient presents with   Abdominal Pain    Courtney Landry is a 51 y.o. female with past medical history of appendectomy, status post cholecystectomy, status post appendectomy on 10/12/2021.  Presents to the emergency department with a complaint of abdominal distention, constipation, nausea and vomiting.  Patient reports that since her appendectomy she has been having problems with constipation, nausea, vomiting, and abdominal distention.  Patient reports that her symptoms have gotten worse over the last 2 weeks.  Patient states that she is able to tolerate fluid intake without difficulty.  However when eating solid food she notices abdominal distention and has difficulty with nausea and vomiting.  Patient reports vomiting 2-3 times in the last 24 hours.  Patient describes emesis as stomach contents.  Patient reports that she is having difficulty with constipation as well.  States that she has had to take suppositories and laxatives to facilitate bowel movements.  Patient denies any blood in stool, melena, or pain with defecation.  Patient denies any opiate pain medication use.  Patient states that she has followed up with general surgery team due to her symptoms and had CT abdomen pelvis performed recently.  Patient denies any fever, chills, dysuria, hematuria, urinary urgency, vaginal pain, vaginal bleeding, vaginal discharge.   Abdominal Pain Associated symptoms: constipation, nausea and vomiting   Associated symptoms: no chest pain, no chills, no diarrhea, no dysuria, no fever, no hematuria, no shortness of breath, no vaginal bleeding and no vaginal discharge       Home Medications Prior to Admission medications   Medication Sig Start Date End Date Taking? Authorizing Provider  AJOVY 225 MG/1.5ML SOAJ INJECT 225 MG INTO THE SKIN EVERY 30 (THIRTY)  DAYS. 10/20/21   Genia Harold, MD  ALPRAZolam Duanne Moron) 1 MG tablet Take 1 tablet by mouth 2 (two) times daily as needed. 08/17/16   [provider]  butalbital-acetaminophen-caffeine (FIORICET) 50-325-40 MG tablet TAKE 1-2 TABLETS BY MOUTH 3 TIMES DAILY AS NEEDED FOR HEADACHES. SCHEDULE APPT FOR FURTHER REFILLS 04/22/21   [provider]  Cholecalciferol 50 MCG (2000 UT) CAPS Take by mouth daily.    [provider]  cyanocobalamin 1000 MCG tablet Take by mouth daily.    [provider]  gabapentin (NEURONTIN) 300 MG capsule Take 300 mg at bedtime for one week, then take 300 mg twice a day for one week, then take 300 mg three times a day 10/23/21   Genia Harold, MD  HYDROmorphone (DILAUDID) 4 MG tablet Take 1 tablet (4 mg total) by mouth every 4 (four) hours as needed (for pain). 01/10/17   Molpus, John, MD  lamoTRIgine (LAMICTAL) 150 MG tablet One tab at 8 AM and one at bed time 10/10/19   [provider]  meloxicam (MOBIC) 7.5 MG tablet Take 7.5 mg by mouth daily. 08/21/21   [provider]  Multiple Vitamins-Minerals (THERA-M) TABS Take 1 tablet by mouth daily.    [provider]  ondansetron (ZOFRAN) 4 MG tablet Take 1 tablet (4 mg total) by mouth daily as needed for nausea or vomiting. 10/12/21 10/12/22  Cornett, Marcello Moores, MD  oxyCODONE-acetaminophen (PERCOCET) 5-325 MG tablet Take 1 tablet by mouth every 4 (four) hours as needed for severe pain. 10/12/21 10/12/22  Cornett, Marcello Moores, MD  QUEtiapine (SEROQUEL) 25 MG tablet Take 25 mg by mouth at bedtime. 06/20/21   [provider]  tiZANidine (ZANAFLEX) 4 MG tablet Take 4 mg by mouth every 8 (eight) hours as needed. 08/21/21   [provider]  Vilazodone HCl (VIIBRYD) 40 MG TABS TAKE ONE TABLET BY MOUTH DAILY. TAKE WITH FOOD 10/11/19   [provider]      Allergies    Hydrocodone-acetaminophen and Tramadol    Review of Systems   Review of Systems  Constitutional:   Negative for chills and fever.  Respiratory:  Negative for shortness of breath.   Cardiovascular:  Negative for chest pain.  Gastrointestinal:  Positive for abdominal distention, abdominal pain, constipation, nausea and vomiting. Negative for anal bleeding, blood in stool, diarrhea and rectal pain.  Genitourinary:  Negative for difficulty urinating, dysuria, flank pain, hematuria, urgency, vaginal bleeding, vaginal discharge and vaginal pain.  Musculoskeletal:  Negative for back pain and neck pain.  Skin:  Negative for color change and rash.  Neurological:  Negative for dizziness, syncope, light-headedness and headaches.  Psychiatric/Behavioral:  Negative for confusion.    Physical Exam Updated Vital Signs BP 139/90 (BP Location: Right Arm)    Pulse 78    Temp 98.6 F (37 C)    Resp 19    Ht 5\' 3"  (1.6 m)    Wt 80.3 kg    SpO2 100%    BMI 31.36 kg/m  Physical Exam Vitals and nursing note reviewed.  Constitutional:      General: She is not in acute distress.    Appearance: She is not ill-appearing, toxic-appearing or diaphoretic.  HENT:     Head: Normocephalic.  Eyes:     General: No scleral icterus.       Right eye: No discharge.        Left eye: No discharge.  Cardiovascular:     Rate and Rhythm: Normal rate.  Pulmonary:     Effort: Pulmonary effort is normal.  Abdominal:     General: Abdomen is protuberant. A surgical scar is present. Bowel sounds are normal. There is no distension. There are no signs of injury.     Palpations: Abdomen is soft.     Tenderness: There is no abdominal tenderness. There is no guarding or rebound.     Hernia: There is no hernia in the umbilical area or ventral area.     Comments: Well-healed laparoscopic surgical incision scars  Skin:    General: Skin is warm and dry.  Neurological:     General: No focal deficit present.     Mental Status: She is alert.     GCS: GCS eye subscore is 4. GCS verbal subscore is 5. GCS motor subscore is 6.   Psychiatric:        Behavior: Behavior is cooperative.    ED Results / Procedures / Treatments   Labs (all labs ordered are listed, but only abnormal results are displayed) Labs Reviewed  COMPREHENSIVE METABOLIC PANEL - Abnormal; Notable for the following components:      Result Value   AST 12 (*)    All other components within normal limits  CBC - Abnormal; Notable for the following components:   WBC 16.8 (*)    All other components within normal limits  URINALYSIS, ROUTINE W REFLEX MICROSCOPIC - Abnormal; Notable for the following components:   Color, Urine COLORLESS (*)    Leukocytes,Ua TRACE (*)    Bacteria, UA RARE (*)    All other components within normal limits  LIPASE, BLOOD    EKG None  Radiology No  results found.  Procedures Procedures    Medications Ordered in ED Medications  sodium phosphate (FLEET) 7-19 GM/118ML enema 1 enema (1 enema Rectal Given 11/17/21 1921)    ED Course/ Medical Decision Making/ A&P Clinical Course as of 11/18/21 0129  Mon Nov 17, 2021  1851 Spoke with Dr. Windle Guard who did not recommend any repeat imaging or need for admission at this time.  Advised to have patient call the office tomorrow for follow-up. [PB]    Clinical Course User Index [PB] Loni Beckwith, PA-C                           Medical Decision Making Amount and/or Complexity of Data Reviewed Labs: ordered.  Risk OTC drugs.   Alert 51 year old female in no acute distress, nontoxic-appearing.  Presents emergency department chief plaint of nausea, vomiting, abdominal distention, and constipation.  Information was obtained from patient.  Past medical records were reviewed including previous provider notes, labs, and imaging.  Patient has medical history as outlined in HPI which complicates her care.  On physical exam abdomen soft, nondistended, nontender with no guarding or rebound tenderness.  Will obtain CMP, CBC, lipase and UA.  Patient had CT imaging  performed on 11/14/2021 which shows status post appendectomy without evidence for pelvic abscess or right lower quadrant inflammation.  No bowel obstruction.  Large stool burden within the colon.  4 mm left distal ureteral stone near the UVJ without significant hydronephrosis. Per chart review patient was noted to have 4 mm left distal ureteral stone near the UVJ on CT imaging obtained on 10/12/2021.  Labs were reviewed and independently interpreted by myself.  Pertinent findings include: -Leukocytosis at 16.8.  This appears unchanged from labs obtained on 10/12/2021. -CMP unremarkable, lipase within normal limits, UA shows no signs of infection.  Due to patient's symptoms starting after recent appendectomy will obtain consult with general surgery.  I spoke with Dr. Windle Guard with general surgery.  See above note for further information regarding this consult.  Due to large stool burden noted within the colon will perform enema at this time.  Unable to order smog enema at this location.  RN reports that patient had bowel movement after receiving enema and is requesting to be discharged.  We will discharge patient to follow-up closely with general surgery.  Patient reports that she has follow-up appointment tomorrow.  Discussed results, findings, treatment and follow up. Patient advised of return precautions. Patient verbalized understanding and agreed with plan.  Patient care discussed with attending physician Dr.Tegeler        Final Clinical Impression(s) / ED Diagnoses Final diagnoses:  None    Rx / DC Orders ED Discharge Orders     None         Loni Beckwith, PA-C 11/18/21 0139    Tegeler, Gwenyth Allegra, MD 11/20/21 1148

## 2021-11-17 NOTE — ED Notes (Signed)
Called for Pt in both Waiting Rooms x3. No answer

## 2021-12-22 ENCOUNTER — Telehealth: Payer: Self-pay

## 2021-12-22 NOTE — Telephone Encounter (Signed)
PA for ajovy received instant approval. ? ?Request Reference Number: [id number]. AJOVY INJ 225/1.5 is approved through 12/23/2022. Your patient may now fill this prescription and it will be covered. ? ? ?

## 2021-12-22 NOTE — Telephone Encounter (Signed)
Renewal PA for ajovy  ?(Key: [id number]) ? ?[employer] is reviewing your PA request. Typically an electronic response will be received within 24-72 hours. To check for an update later, open this request from your dashboard. ? ?You may close this dialog and return to your dashboard to perform other tasks. ?

## 2022-02-09 ENCOUNTER — Telehealth: Payer: Self-pay

## 2022-02-09 NOTE — Telephone Encounter (Signed)
Patient left a voicemail with her office this morning requesting a call to schedule her MRI.  She can be reached at [phone].

## 2022-02-10 ENCOUNTER — Encounter: Payer: Self-pay | Admitting: Psychiatry

## 2022-02-10 NOTE — Telephone Encounter (Signed)
45 mins MRI brain w/wo contrast Dr. Delena Bali [facility] NPR ref #[id number] scheduled at [facility] 02/11/22 at 3pm

## 2022-02-11 ENCOUNTER — Ambulatory Visit: Payer: No Typology Code available for payment source

## 2022-02-11 DIAGNOSIS — R519 Headache, unspecified: Secondary | ICD-10-CM | POA: Diagnosis not present

## 2022-02-11 MED ORDER — GADOBENATE DIMEGLUMINE 529 MG/ML IV SOLN
15.0000 mL | Freq: Once | INTRAVENOUS | Status: AC | PRN
  Administered 2022-02-11: 15 mL via INTRAVENOUS

## 2022-10-31 ENCOUNTER — Encounter (HOSPITAL_BASED_OUTPATIENT_CLINIC_OR_DEPARTMENT_OTHER): Payer: Self-pay | Admitting: Emergency Medicine

## 2022-10-31 ENCOUNTER — Emergency Department (HOSPITAL_BASED_OUTPATIENT_CLINIC_OR_DEPARTMENT_OTHER)
Admission: EM | Admit: 2022-10-31 | Discharge: 2022-10-31 | Disposition: A | Discharge status: Home / Self Care | Payer: No Typology Code available for payment source | Attending: Emergency Medicine | Admitting: Emergency Medicine

## 2022-10-31 ENCOUNTER — Other Ambulatory Visit: Payer: Self-pay

## 2022-10-31 ENCOUNTER — Emergency Department (HOSPITAL_BASED_OUTPATIENT_CLINIC_OR_DEPARTMENT_OTHER): Payer: No Typology Code available for payment source

## 2022-10-31 ENCOUNTER — Telehealth (HOSPITAL_BASED_OUTPATIENT_CLINIC_OR_DEPARTMENT_OTHER): Payer: Self-pay | Admitting: Emergency Medicine

## 2022-10-31 DIAGNOSIS — R0789 Other chest pain: Secondary | ICD-10-CM | POA: Insufficient documentation

## 2022-10-31 DIAGNOSIS — R791 Abnormal coagulation profile: Secondary | ICD-10-CM | POA: Diagnosis not present

## 2022-10-31 DIAGNOSIS — D72829 Elevated white blood cell count, unspecified: Secondary | ICD-10-CM | POA: Diagnosis not present

## 2022-10-31 DIAGNOSIS — N200 Calculus of kidney: Secondary | ICD-10-CM | POA: Insufficient documentation

## 2022-10-31 DIAGNOSIS — R109 Unspecified abdominal pain: Secondary | ICD-10-CM

## 2022-10-31 DIAGNOSIS — E860 Dehydration: Secondary | ICD-10-CM | POA: Insufficient documentation

## 2022-10-31 DIAGNOSIS — Z859 Personal history of malignant neoplasm, unspecified: Secondary | ICD-10-CM | POA: Insufficient documentation

## 2022-10-31 LAB — COMPREHENSIVE METABOLIC PANEL
ALT: 13 U/L (ref 0–44)
AST: 16 U/L (ref 15–41)
Albumin: 4.2 g/dL (ref 3.5–5.0)
Alkaline Phosphatase: 63 U/L (ref 38–126)
Anion gap: 6 (ref 5–15)
BUN: 14 mg/dL (ref 6–20)
CO2: 26 mmol/L (ref 22–32)
Calcium: 8.9 mg/dL (ref 8.9–10.3)
Chloride: 105 mmol/L (ref 98–111)
Creatinine, Ser: 0.95 mg/dL (ref 0.44–1.00)
GFR, Estimated: >60 mL/min (ref >60)
Glucose, Bld: 72 mg/dL (ref 70–99)
Potassium: 4.1 mmol/L (ref 3.5–5.1)
Sodium: 137 mmol/L (ref 135–145)
Total Bilirubin: 0.5 mg/dL (ref 0.3–1.2)
Total Protein: 7.4 g/dL (ref 6.5–8.1)

## 2022-10-31 LAB — URINALYSIS, ROUTINE W REFLEX MICROSCOPIC
Bilirubin Urine: NEGATIVE
Glucose, UA: NEGATIVE mg/dL
Ketones, ur: NEGATIVE mg/dL
Nitrite: NEGATIVE
Protein, ur: NEGATIVE mg/dL
Specific Gravity, Urine: 1.015 (ref 1.005–1.030)
pH: 6.5 (ref 5.0–8.0)

## 2022-10-31 LAB — TROPONIN I (HIGH SENSITIVITY): Troponin I (High Sensitivity): 2 ng/L (ref <18)

## 2022-10-31 LAB — CBC
HCT: 38.1 % (ref 36.0–46.0)
Hemoglobin: 12.8 g/dL (ref 12.0–15.0)
MCH: 29.2 pg (ref 26.0–34.0)
MCHC: 33.6 g/dL (ref 30.0–36.0)
MCV: 86.8 fL (ref 80.0–100.0)
Platelets: 404 10*3/uL — ABNORMAL HIGH (ref 150–400)
RBC: 4.39 MIL/uL (ref 3.87–5.11)
RDW: 14.6 % (ref 11.5–15.5)
WBC: 12.1 10*3/uL — ABNORMAL HIGH (ref 4.0–10.5)
nRBC: 0 % (ref 0.0–0.2)

## 2022-10-31 LAB — URINALYSIS, MICROSCOPIC (REFLEX)

## 2022-10-31 LAB — LIPASE, BLOOD: Lipase: 44 U/L (ref 11–51)

## 2022-10-31 LAB — PREGNANCY, URINE: Preg Test, Ur: NEGATIVE

## 2022-10-31 MED ORDER — METOCLOPRAMIDE HCL 10 MG PO TABS: 10.0000 mg | ORAL_TABLET | Freq: Four times a day (QID) | ORAL | 0 refills | Status: AC

## 2022-10-31 MED ORDER — LACTATED RINGERS IV BOLUS
1000.0000 mL | Freq: Once | INTRAVENOUS | Status: AC
  Administered 2022-10-31: 1000 mL via INTRAVENOUS

## 2022-10-31 MED ORDER — DICYCLOMINE HCL 10 MG PO CAPS
20.0000 mg | ORAL_CAPSULE | Freq: Once | ORAL | Status: AC
  Administered 2022-10-31: 20 mg via ORAL
  Filled 2022-10-31: qty 2

## 2022-10-31 MED ORDER — DICYCLOMINE HCL 20 MG PO TABS: 20.0000 mg | ORAL_TABLET | Freq: Two times a day (BID) | ORAL | 0 refills | Status: AC

## 2022-10-31 MED ORDER — DIPHENHYDRAMINE HCL 25 MG PO CAPS
25.0000 mg | ORAL_CAPSULE | Freq: Once | ORAL | Status: AC
  Administered 2022-10-31: 25 mg via ORAL
  Filled 2022-10-31: qty 1

## 2022-10-31 MED ORDER — METOCLOPRAMIDE HCL 5 MG/ML IJ SOLN
10.0000 mg | Freq: Once | INTRAMUSCULAR | Status: AC
  Administered 2022-10-31: 10 mg via INTRAVENOUS
  Filled 2022-10-31: qty 2

## 2022-10-31 MED ORDER — FENTANYL CITRATE PF 50 MCG/ML IJ SOSY
50.0000 ug | PREFILLED_SYRINGE | INTRAMUSCULAR | Status: DC | PRN
  Administered 2022-10-31: 50 ug via INTRAVENOUS
  Filled 2022-10-31: qty 1

## 2022-10-31 NOTE — ED Notes (Signed)
Patient is alert and oriented.  She reports her pain has improved.  She is ready to provide a urine specimen.  She is denying any dizziness.  Patient mother remains at bedside.  IV site remains intact and unremarkable.

## 2022-10-31 NOTE — ED Provider Notes (Signed)
[facility] Provider Note   CSN: [id number] Arrival date & time: 10/31/22  1701     History  Chief Complaint  Patient presents with   Dizziness   Flank Pain    Courtney Landry is a 52 y.o. female.   Dizziness Flank Pain  Patient is a 52 year old female with past medical history significant for anxiety, depression, numerous kidney stones, migraine headaches, she is status post sleeve gastrectomy, appendectomy, cholecystectomy, tubal ligation  Patient is present emergency room today with multiple symptoms.  She is primarily here for some right flank pain she was recently seen by urologist earlier this week and was placed on Flomax and it was assumed that she had a renal stone.  She states that she has had some persistent right flank pain although perhaps some improvement today.  She also complains of some lightheadedness and fatigue and elevated blood pressure readings over the past week although she states some of the symptoms been going on for months.  She states occasionally she will have chest pressure but denies any current chest pain.  No lightheadedness or dizziness currently but she is laying down.  No shortness of breath.  No vomiting but does endorse some nausea.  Her chest pain is neither exertional nor pleuritic.  No recent surgeries, hospitalization, long travel, hemoptysis, estrogen containing OCP, cancer history.  No unilateral leg swelling.  No history of PE or VTE.      Home Medications Prior to Admission medications   Medication Sig Start Date End Date Taking? Authorizing Provider  AJOVY 225 MG/1.5ML SOAJ INJECT 225 MG INTO THE SKIN EVERY 30 (THIRTY) DAYS. 10/20/21   Genia Harold, MD  ALPRAZolam Duanne Moron) 1 MG tablet Take 1 tablet by mouth 2 (two) times daily as needed. 08/17/16   [provider]  butalbital-acetaminophen-caffeine (FIORICET) 50-325-40 MG tablet TAKE 1-2 TABLETS BY MOUTH 3 TIMES DAILY AS  NEEDED FOR HEADACHES. SCHEDULE APPT FOR FURTHER REFILLS 04/22/21   [provider]  Cholecalciferol 50 MCG (2000 UT) CAPS Take by mouth daily.    [provider]  cyanocobalamin 1000 MCG tablet Take by mouth daily.    [provider]  dicyclomine (BENTYL) 20 MG tablet Take 1 tablet (20 mg total) by mouth 2 (two) times daily. 10/31/22   Tedd Sias, PA  gabapentin (NEURONTIN) 300 MG capsule Take 300 mg at bedtime for one week, then take 300 mg twice a day for one week, then take 300 mg three times a day 10/23/21   Genia Harold, MD  HYDROmorphone (DILAUDID) 4 MG tablet Take 1 tablet (4 mg total) by mouth every 4 (four) hours as needed (for pain). 01/10/17   Molpus, John, MD  lamoTRIgine (LAMICTAL) 150 MG tablet One tab at 8 AM and one at bed time 10/10/19   [provider]  meloxicam (MOBIC) 7.5 MG tablet Take 7.5 mg by mouth daily. 08/21/21   [provider]  metoCLOPramide (REGLAN) 10 MG tablet Take 1 tablet (10 mg total) by mouth every 6 (six) hours. 10/31/22   Tedd Sias, PA  Multiple Vitamins-Minerals (THERA-M) TABS Take 1 tablet by mouth daily.    [provider]  QUEtiapine (SEROQUEL) 25 MG tablet Take 25 mg by mouth at bedtime. 06/20/21   [provider]  tiZANidine (ZANAFLEX) 4 MG tablet Take 4 mg by mouth every 8 (eight) hours as needed. 08/21/21   [provider]  Vilazodone HCl (VIIBRYD) 40 MG TABS TAKE ONE  TABLET BY MOUTH DAILY. TAKE WITH FOOD 10/11/19   [provider]      Allergies    Hydrocodone-acetaminophen and Tramadol    Review of Systems   Review of Systems  Genitourinary:  Positive for flank pain.  Neurological:  Positive for dizziness.    Physical Exam Updated Vital Signs BP 131/79 (BP Location: Right Arm)   Pulse 60   Temp 97.9 F (36.6 C) (Oral)   Resp 15   Ht 5' 3"$  (1.6 m)   Wt 74.8 kg   SpO2 99%   BMI 29.23 kg/m  Physical Exam Vitals and nursing note reviewed.   Constitutional:      General: She is not in acute distress. HENT:     Head: Normocephalic and atraumatic.     Nose: Nose normal.  Eyes:     General: No scleral icterus. Cardiovascular:     Rate and Rhythm: Normal rate and regular rhythm.     Pulses: Normal pulses.     Heart sounds: Normal heart sounds.  Pulmonary:     Effort: Pulmonary effort is normal. No respiratory distress.     Breath sounds: No wheezing.  Abdominal:     Palpations: Abdomen is soft.     Tenderness: There is abdominal tenderness. There is right CVA tenderness.     Comments: Epigastric   Musculoskeletal:     Cervical back: Normal range of motion.     Right lower leg: No edema.     Left lower leg: No edema.  Skin:    General: Skin is warm and dry.     Capillary Refill: Capillary refill takes less than 2 seconds.  Neurological:     Mental Status: She is alert. Mental status is at baseline.  Psychiatric:        Mood and Affect: Mood normal.        Behavior: Behavior normal.     ED Results / Procedures / Treatments   Labs (all labs ordered are listed, but only abnormal results are displayed) Labs Reviewed  CBC - Abnormal; Notable for the following components:      Result Value   WBC 12.1 (*)    Platelets 404 (*)    All other components within normal limits  URINALYSIS, ROUTINE W REFLEX MICROSCOPIC - Abnormal; Notable for the following components:   Hgb urine dipstick TRACE (*)    Leukocytes,Ua TRACE (*)    All other components within normal limits  URINALYSIS, MICROSCOPIC (REFLEX) - Abnormal; Notable for the following components:   Bacteria, UA RARE (*)    All other components within normal limits  LIPASE, BLOOD  COMPREHENSIVE METABOLIC PANEL  PREGNANCY, URINE  TROPONIN I (HIGH SENSITIVITY)    EKG None  Radiology DG Chest 2 View  Result Date: 10/31/2022 CLINICAL DATA:  Chest pain for 2 weeks. EXAM: CHEST - 2 VIEW COMPARISON:  08/24/2016 FINDINGS: The heart size and mediastinal contours are  within normal limits. Both lungs are clear. The visualized skeletal structures are unremarkable. IMPRESSION: No active cardiopulmonary disease. Electronically Signed   By: Marlaine Hind M.D.   On: 10/31/2022 17:49   CT Renal Stone Study  Result Date: 10/31/2022 CLINICAL DATA:  Right flank pain EXAM: CT ABDOMEN AND PELVIS WITHOUT CONTRAST TECHNIQUE: Multidetector CT imaging of the abdomen and pelvis was performed following the standard protocol without IV contrast. RADIATION DOSE REDUCTION: This exam was performed according to the departmental dose-optimization program which includes automated exposure control, adjustment of the mA and/or kV  according to patient size and/or use of iterative reconstruction technique. COMPARISON:  CT abdomen and pelvis with contrast November 14, 2021 FINDINGS: Lower chest: Trace pericardial effusion, likely physiologic. The visualized bibasilar lungs clear. Hepatobiliary: No focal liver abnormality is seen. The gallbladder is surgically absent. No biliary ductal dilation. Pancreas: Unremarkable. No pancreatic ductal dilatation or surrounding inflammatory changes. Spleen: Normal in size without focal abnormality. Adrenals/Urinary Tract: Adrenal glands are unremarkable. No hydronephrosis. Punctate right right-greater-than-left calyceal nephrolithiasis. No definite ureteral stones identified. Bladder is unremarkable. Stomach/Bowel: Stomach is within normal limits. The appendix is not visualized. No evidence of bowel wall thickening, distention, or inflammatory changes. Vascular/Lymphatic: Aortic atherosclerosis. No enlarged abdominal or pelvic lymph nodes. Reproductive: Uterus and bilateral adnexa are unremarkable. Other: No abdominal wall hernia or abnormality. No abdominopelvic ascites. Musculoskeletal: No acute or significant osseous findings. IMPRESSION: Bilateral punctate nephrolithiasis without hydronephrosis. Electronically Signed   By: Beryle Flock M.D.   On: 10/31/2022  17:44    Procedures Procedures    Medications Ordered in ED Medications  fentaNYL (SUBLIMAZE) injection 50 mcg (50 mcg Intravenous Given 10/31/22 1752)  metoCLOPramide (REGLAN) injection 10 mg (10 mg Intravenous Given 10/31/22 1753)  diphenhydrAMINE (BENADRYL) capsule 25 mg (25 mg Oral Given 10/31/22 1744)  dicyclomine (BENTYL) capsule 20 mg (20 mg Oral Given 10/31/22 1744)  lactated ringers bolus 1,000 mL (0 mLs Intravenous Stopped 10/31/22 2027)    ED Course/ Medical Decision Making/ A&P                             Medical Decision Making Amount and/or Complexity of Data Reviewed Labs: ordered. Radiology: ordered.  Risk Prescription drug management.   This patient presents to the ED for concern of R flank pain, also LH/fatigue, this involves a number of treatment options, and is a complaint that carries with it a moderate risk of complications and morbidity. A differential diagnosis was considered for the patient's symptoms which is discussed below:   The differential diagnosis of weakness includes but is not limited to neurologic causes (GBS, myasthenia gravis, CVA, MS, ALS, transverse myelitis, spinal cord injury, CVA, botulism, ) and other causes: ACS, Arrhythmia, syncope, orthostatic hypotension, sepsis, hypoglycemia, electrolyte disturbance, hypothyroidism, respiratory failure, symptomatic anemia, dehydration, heat injury, polypharmacy, malignancy.   The differential diagnosis of emergent flank pain includes, but is not limited to :Abdominal aortic aneurysm,, Renal artery embolism,Renal vein thrombosis, Aortic dissection, Mesenteric ischemia, Pyelonephritis, Renal infarction, Renal hemorrhage, Nephrolithiasis/ Renal Colic, Bladder tumor,Cystitis, Biliary colic, Pancreatitis Perforated peptic ulcer Appendicitis ,Inguinal Hernia, Diverticulitis, Bowel obstruction (Ectopic Pregnancy,PID/TOA,Ovarian cyst, Ovarian torsion) Shingles Lower lobe pneumonia, Retroperitoneal  hematoma/abscess/tumor, Epidural abscess, Epidural hematoma    Co morbidities: Discussed in HPI   Brief History:  Patient is a 52 year old female with past medical history significant for anxiety, depression, numerous kidney stones, migraine headaches, she is status post sleeve gastrectomy, appendectomy, cholecystectomy, tubal ligation  Patient is present emergency room today with multiple symptoms.  She is primarily here for some right flank pain she was recently seen by urologist earlier this week and was placed on Flomax and it was assumed that she had a renal stone.  She states that she has had some persistent right flank pain although perhaps some improvement today.  She also complains of some lightheadedness and fatigue and elevated blood pressure readings over the past week although she states some of the symptoms been going on for months.  She states occasionally she will have chest pressure  but denies any current chest pain.  No lightheadedness or dizziness currently but she is laying down.  No shortness of breath.  No vomiting but does endorse some nausea.  Her chest pain is neither exertional nor pleuritic.  No recent surgeries, hospitalization, long travel, hemoptysis, estrogen containing OCP, cancer history.  No unilateral leg swelling.  No history of PE or VTE.     EMR reviewed including pt PMHx, past surgical history and past visits to ER.   See HPI for more details   Lab Tests:   I ordered and independently interpreted labs. Labs notable for CMP unremarkable creatinine normal BUN normal electrolytes without abnormal findings.  CBC with mild leukocytosis but also elevation platelets consistent with dehydration patient appears quite dry on exam.  Lipase within normal limits hepatitis, troponin x 1 within normal limits patient is not having chest pain currently.  Pregnancy test negative urinalysis unremarkable.  Imaging Studies:  NAD. I personally reviewed all imaging  studies and no acute abnormality found. I agree with radiology interpretation.  IMPRESSION:  Bilateral punctate nephrolithiasis without hydronephrosis.      Electronically Signed    By: Beryle Flock M.D.    On: 10/31/2022 17:44     Cardiac Monitoring:  The patient was maintained on a cardiac monitor.  I personally viewed and interpreted the cardiac monitored which showed an underlying rhythm of: NSR EKG non-ischemic   Medicines ordered:  I ordered medication including LR 1L, Reglan, Benadryl, Bentyl, 1 dose of fentanyl Reevaluation of the patient after these medicines showed that the patient resolved I have reviewed the patients home medicines and have made adjustments as needed   Critical Interventions:     Consults/Attending Physician      Reevaluation:  After the interventions noted above I re-evaluated patient and found that they have :resolved   Social Determinants of Health:      Problem List / ED Course:  Patient of multiple symptoms right flank pain as well as some lightheadedness fatigue and lightheadedness.  She is well-appearing on exam and workup is been reassuring.  Her symptoms improved with 1 dose of Reglan and hydration.  He also was given some Bentyl.  Mild leukocytosis and platelet elevation consistent with dehydration given I have low suspicion for infection.  Troponin within normal limits and EKG without evidence of ischemia.  Will discharge home at this time with the understanding that she may have recently passed a kidney stone given the scant hematuria she has.  Does not have any urinary tract infection symptoms such as dysuria frequency or urgency.  She will follow-up with her primary care provider.  Return precautions were discussed.   Dispostion:  After consideration of the diagnostic results and the patients response to treatment, I feel that the patent would benefit from close outpatient follow-up.  Strict return  precautions   Final Clinical Impression(s) / ED Diagnoses Final diagnoses:  Dehydration  Flank pain  Reglan and benadryl sent in separate note  Rx / DC Orders ED Discharge Orders     None         Tedd Sias, [place] 10/31/22 2348    Drenda Freeze, MD 11/01/22 1450

## 2022-10-31 NOTE — Discharge Instructions (Signed)
Your labs are normal appearing. You do have some blood in your urine -- I suspect you passed a stone recently.  Follow up with your primary care provider.   Please use Tylenol or ibuprofen for pain.  You may use 600 mg ibuprofen every 6 hours or 1000 mg of Tylenol every 6 hours.  You may choose to alternate between the 2.  This would be most effective.  Not to exceed 4 g of Tylenol within 24 hours.  Not to exceed 3200 mg ibuprofen 24 hours.

## 2022-10-31 NOTE — ED Triage Notes (Signed)
Pt c/o persistent RT flank pain, saw urologist Tues for same; also c/o dizziness and elevated BP over the last week

## 2022-10-31 NOTE — Telephone Encounter (Signed)
Medication sent to pharmacy  

## 2022-10-31 NOTE — ED Notes (Signed)
Patient remains alert and oriented.  She verbalized understanding of discharge instructions and reasons to return to the ED

## 2023-07-06 IMAGING — CT CT ABD-PELV W/ CM
2 of 5 series · 16 of 46 positions shown, 18 images · IV contrast (Omnipaque)
Comparison: 01/10/2017

CLINICAL DATA: Nausea, vomiting and right lower quadrant pain

EXAM:
CT ABDOMEN AND PELVIS WITH CONTRAST
TECHNIQUE: Multidetector CT imaging of the abdomen and pelvis was performed
using the standard protocol following bolus administration of
intravenous contrast.

[Series 2: axial st · axial · 0.98mm/px · z∈[-427,-7]mm · 13 of 96 slices shown, 15 images]
[im 6/96  soft-tissue]
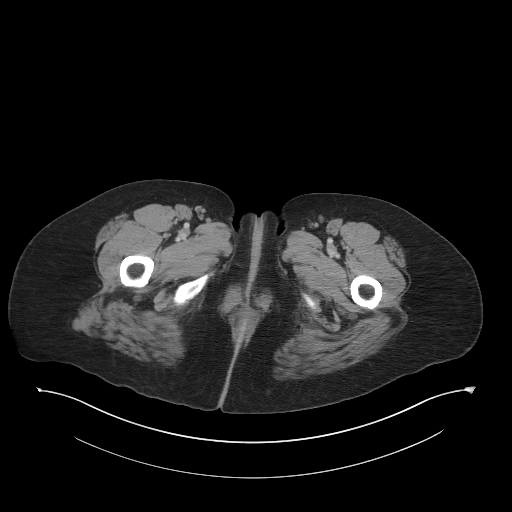
[im 6/96  bone]
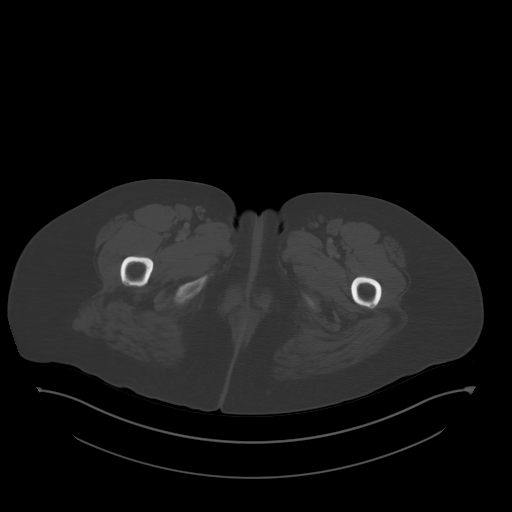
[im 12/96  soft-tissue]
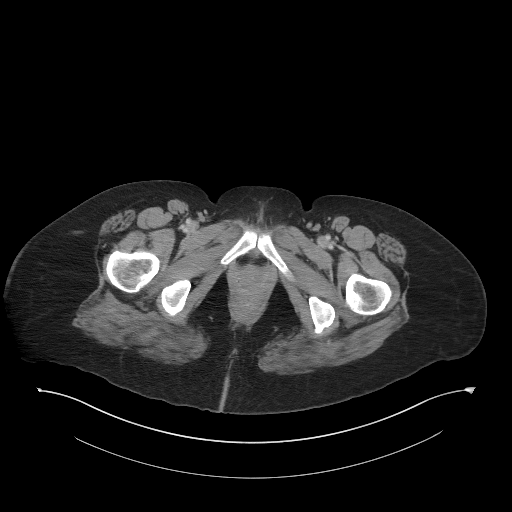
[im 18/96  soft-tissue]
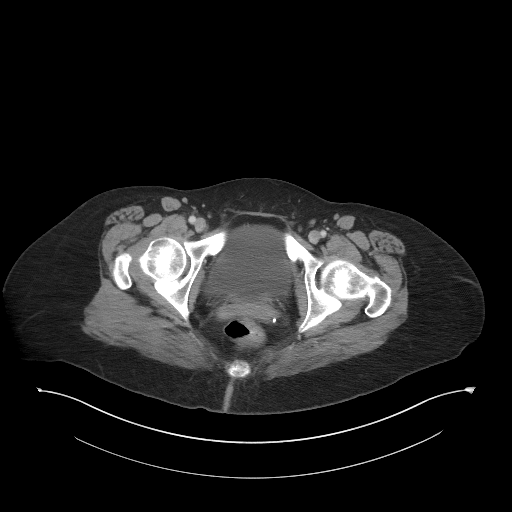
[im 30/96  soft-tissue]
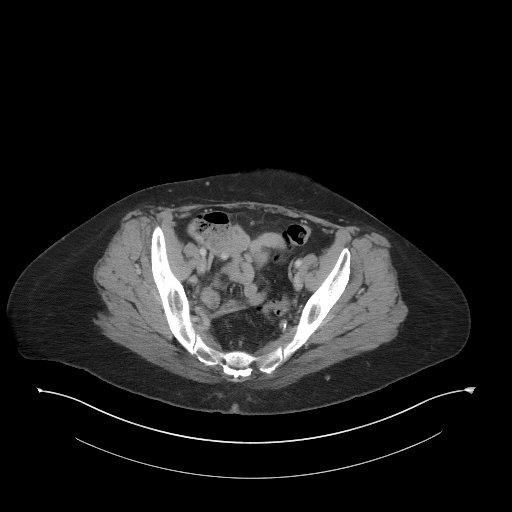
[im 36/96  soft-tissue]
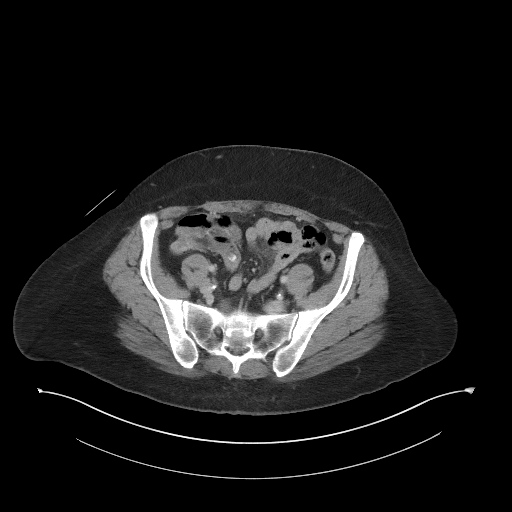
[im 42/96  soft-tissue]
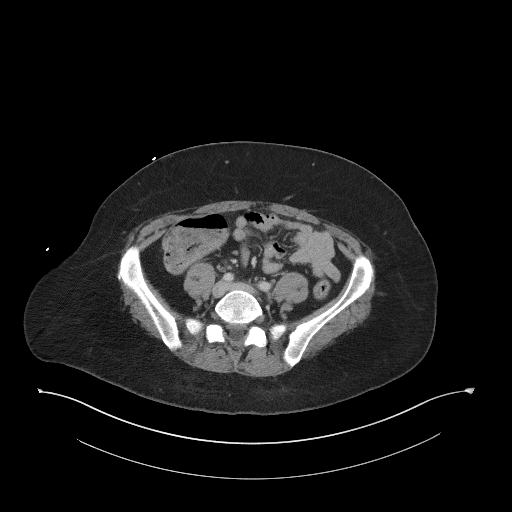
[im 48/96  soft-tissue]
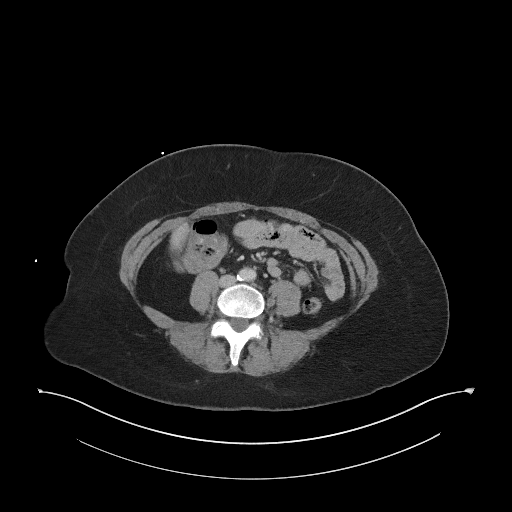
[im 54/96  soft-tissue]
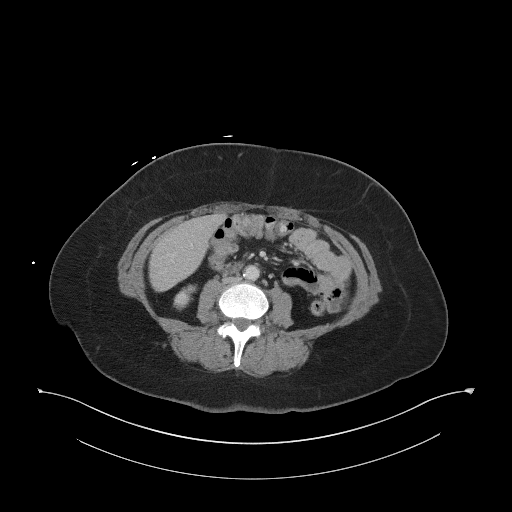
[im 60/96  soft-tissue]
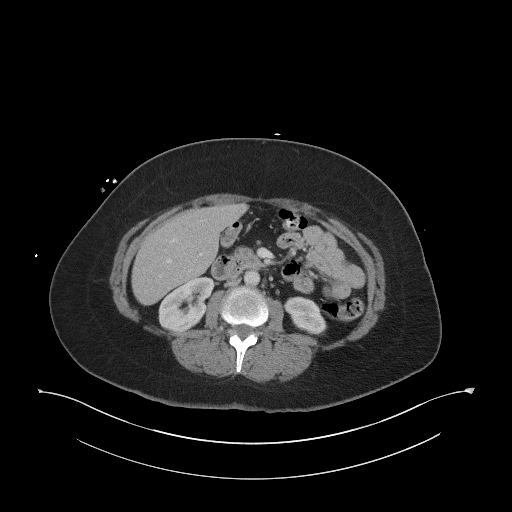
[im 60/96  bone]
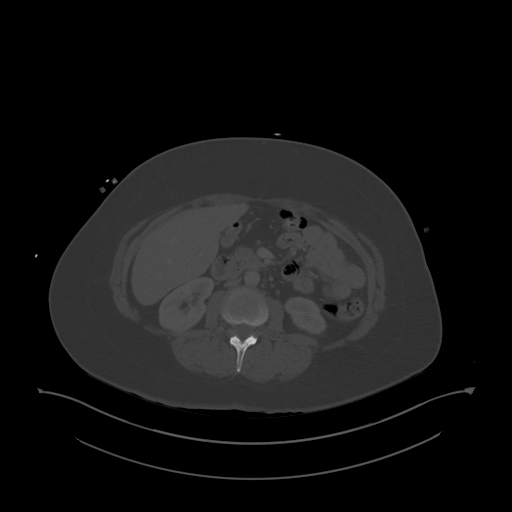
[im 66/96  soft-tissue]
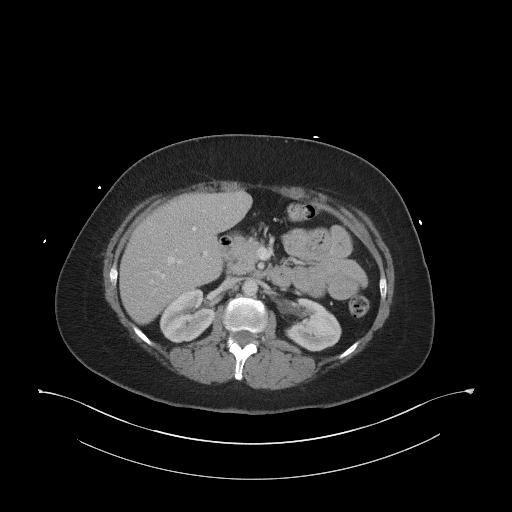
[im 78/96  soft-tissue]
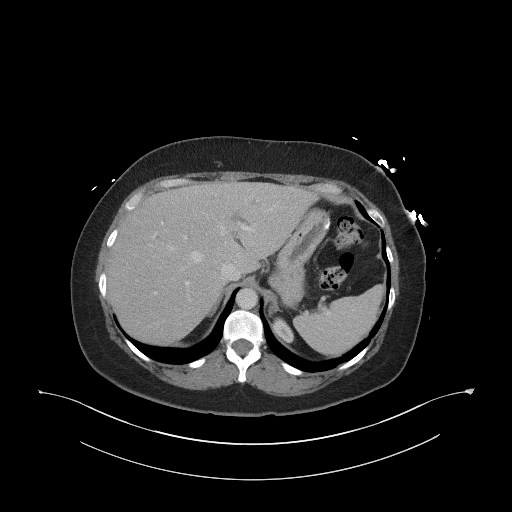
[im 84/96  soft-tissue]
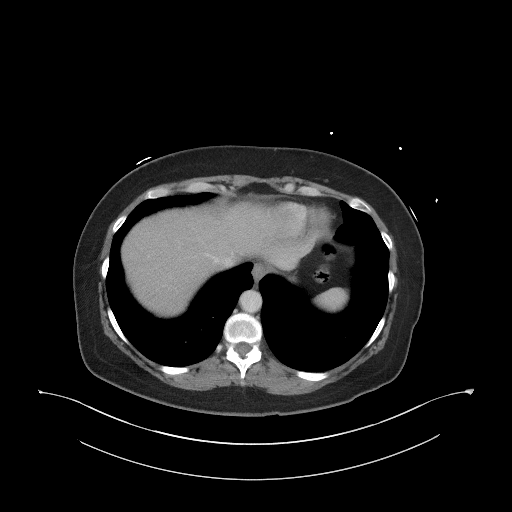
[im 90/96  soft-tissue]
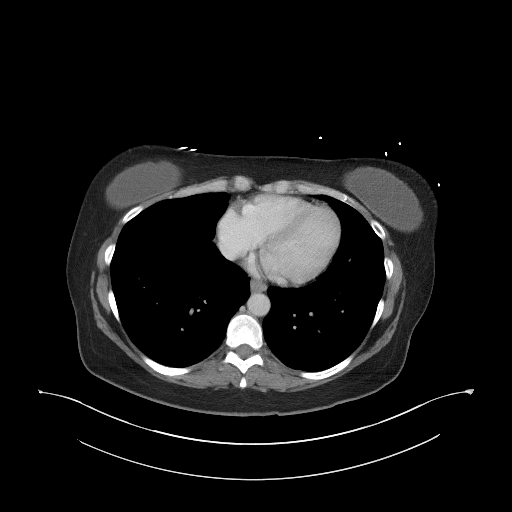

[Series 5: coronal st · coronal · 0.93mm/px · 3 of 103 slices shown]
[im 35/103  soft-tissue]
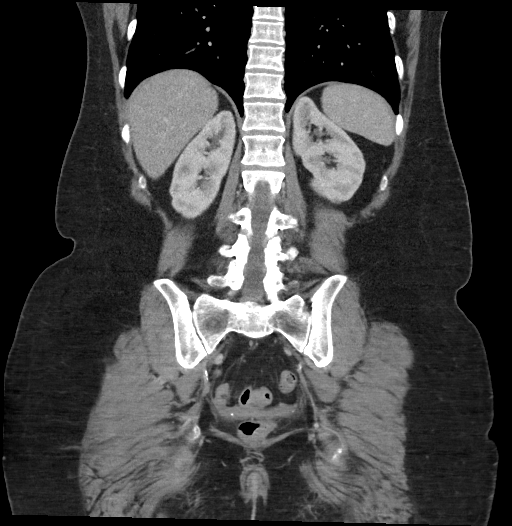
[im 46/103  soft-tissue]
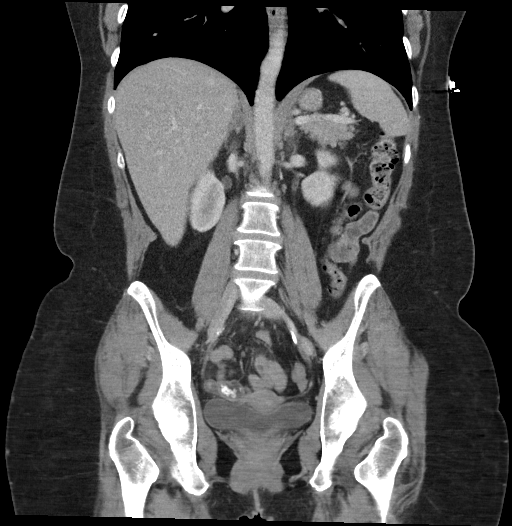
[im 57/103  soft-tissue]
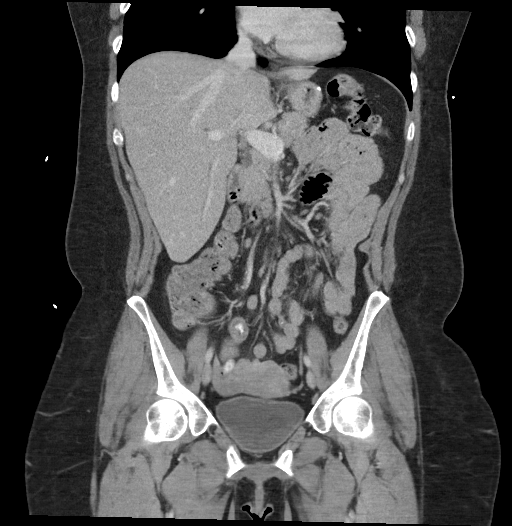

[16 of 46 positions shown; findings below may reference images not displayed]

RADIATION DOSE REDUCTION: This exam was performed according to the
departmental dose-optimization program which includes automated
exposure control, adjustment of the mA and/or kV according to
patient size and/or use of iterative reconstruction technique.

CONTRAST:  100mL OMNIPAQUE IOHEXOL 300 MG/ML  SOLN
FINDINGS: Lower chest: No acute abnormality.

Hepatobiliary: No focal liver abnormality. Status post
cholecystectomy. Mild intrahepatic bile duct dilatation. Common bile
duct measures 7 mm in maximum diameter. No obstructing stone or mass
noted.

Pancreas: Unremarkable. No pancreatic ductal dilatation or
surrounding inflammatory changes.

Spleen: Normal in size without focal abnormality.

Adrenals/Urinary Tract: Normal adrenal glands. Multiple left renal
calculi. The largest is in the inferior pole measuring 4 mm. No
hydronephrosis or mass identified bilaterally. Within the distal
left ureter just before the UVJ there is a stone measuring 4 by 2
mm, image 43/5. This does not result in significant hydroureter or
hydronephrosis.

Stomach/Bowel: Postoperative changes involving the stomach. The
appendix is visualized arising off the base of cecum extending along
the right pelvic sidewall. There are several calcified
appendicoliths identified within the proximal and distal lumen of
the appendix. The diameter of the appendix is increased measuring 1
cm (normal 6 mm or less), image 71/2. No significant no
periappendiceal free fluid or fluid collection. No bowel wall
thickening, inflammation, or distension.

Vascular/Lymphatic: Aortic atherosclerosis. No enlarged abdominal or
pelvic lymph nodes.

Reproductive: Uterus and bilateral adnexa are unremarkable.

Other: No free fluid or fluid collections.

Musculoskeletal: No acute or significant osseous findings.
IMPRESSION: 1. Abnormal increased caliber of the appendix with signs of mural
edema. Multiple appendicoliths identified within the lumen of the
appendix. Findings suspicious for acute appendicitis. No signs of
perforation or abscess.
2. 4 mm stone identified within the distal left ureter just before
lead UVJ. No significant left sided hydronephrosis or hydroureter.
3. Status post cholecystectomy with mild intrahepatic and
extrahepatic bile duct dilatation. No obstructing stone or mass
identified.
4. Aortic Atherosclerosis ([id number]).

## 2023-08-08 IMAGING — CT CT ABD-PELV W/ CM
2 of 5 series · 16 of 46 positions shown, 18 images · IV contrast (APPLIED)
Comparison: CT 10/12/2021

CLINICAL DATA: Generalized pain history of appendectomy

EXAM:
CT ABDOMEN AND PELVIS WITH CONTRAST
TECHNIQUE: Multidetector CT imaging of the abdomen and pelvis was performed
using the standard protocol following bolus administration of
intravenous contrast.

[Series 2: abd pel w · axial · 0.79mm/px · z∈[-598,-192]mm · 13 of 91 slices shown, 15 images]
[im 5/91  soft-tissue]
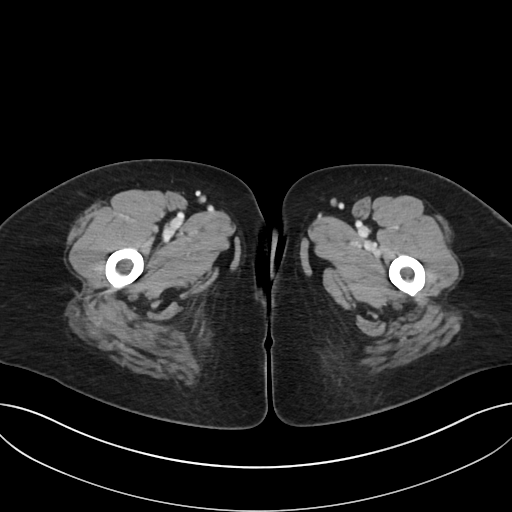
[im 5/91  bone]
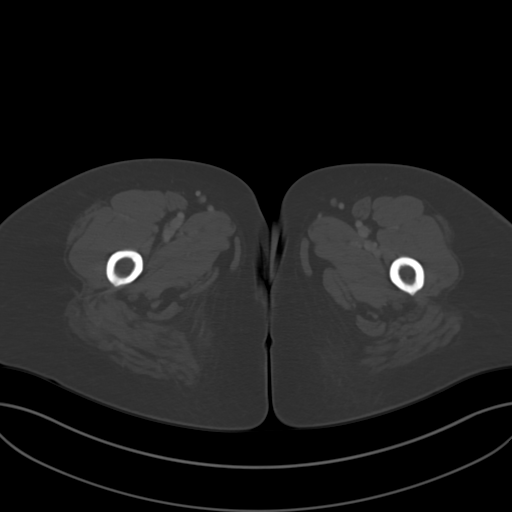
[im 14/91  soft-tissue]
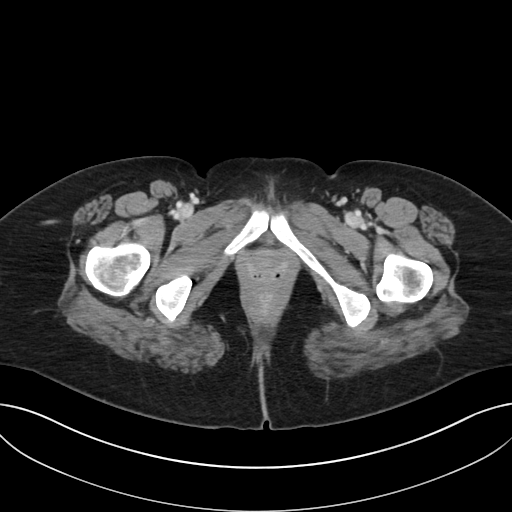
[im 19/91  soft-tissue]
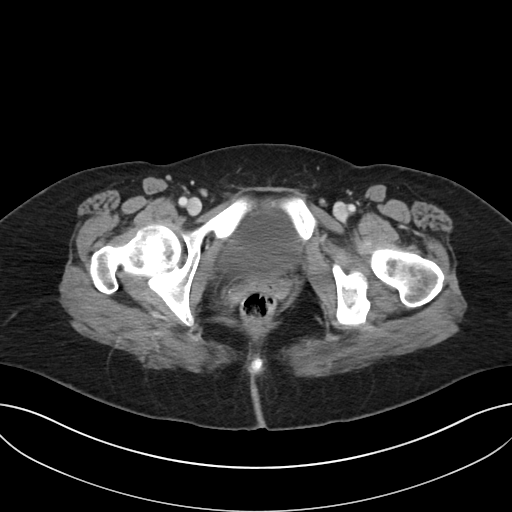
[im 28/91  soft-tissue]
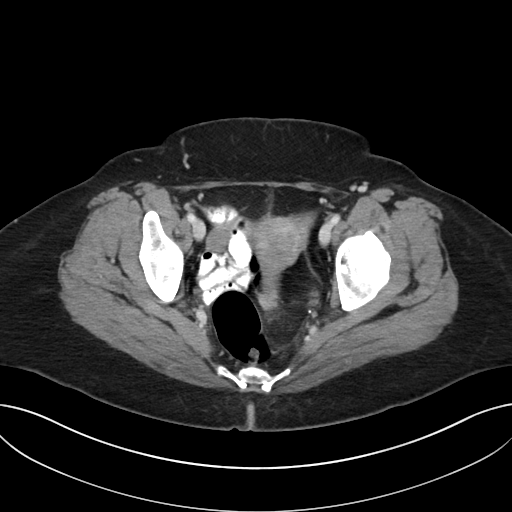
[im 32/91  soft-tissue]
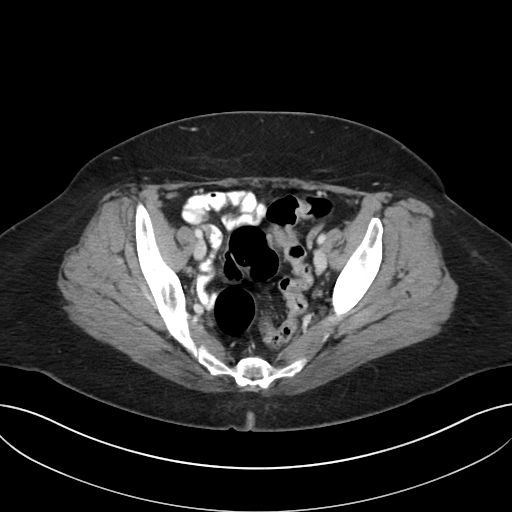
[im 41/91  soft-tissue]
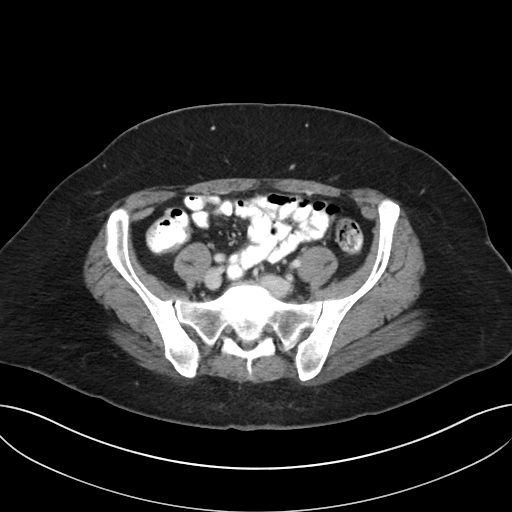
[im 46/91  soft-tissue]
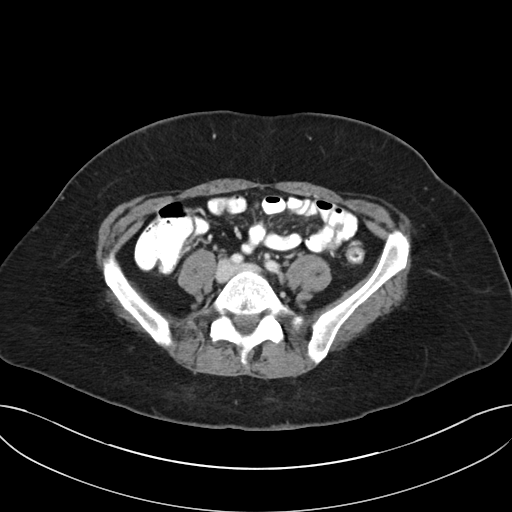
[im 50/91  soft-tissue]
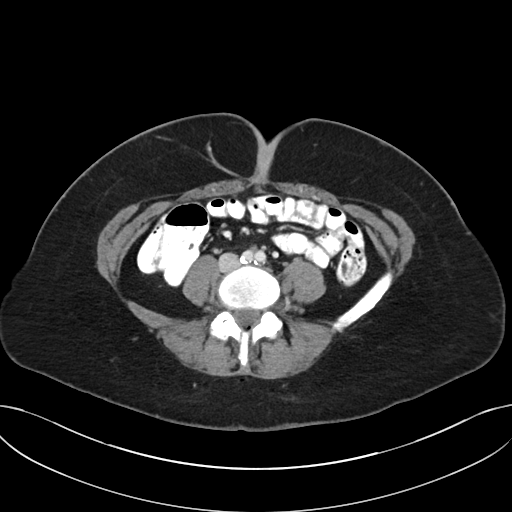
[im 59/91  soft-tissue]
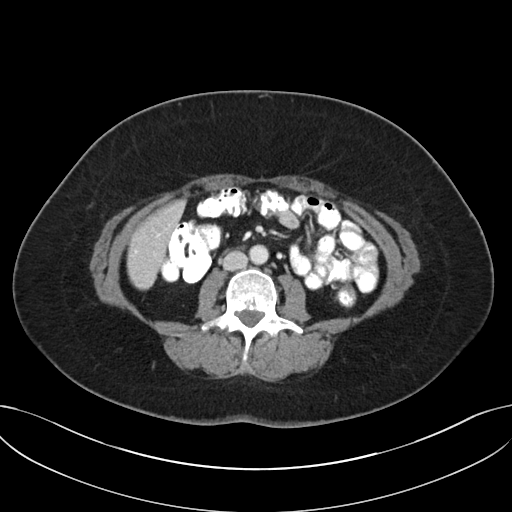
[im 59/91  bone]
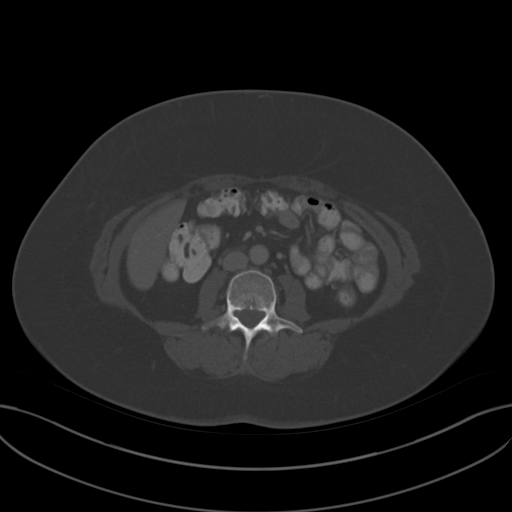
[im 64/91  soft-tissue]
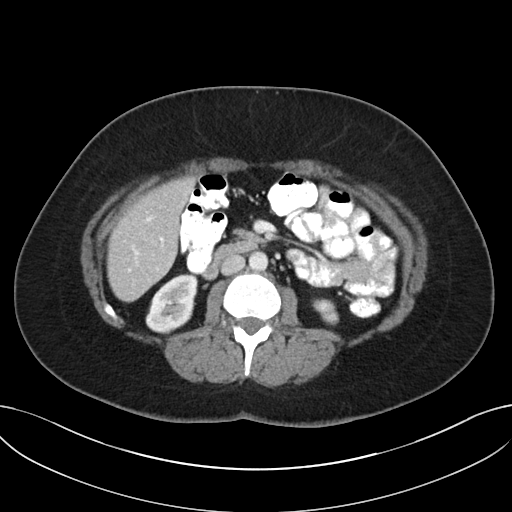
[im 73/91  soft-tissue]
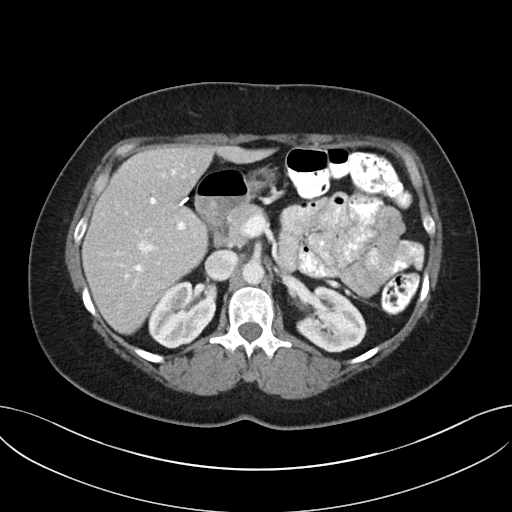
[im 77/91  soft-tissue]
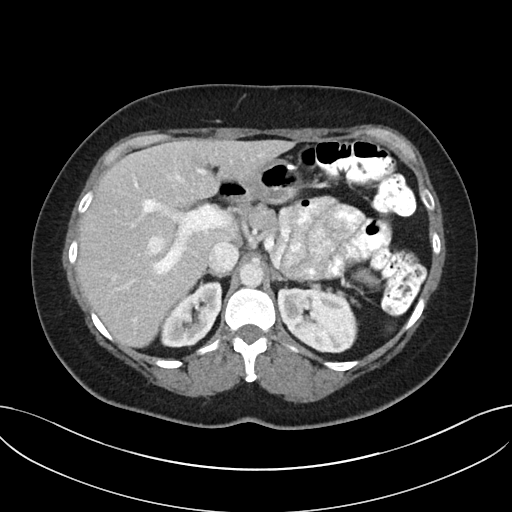
[im 86/91  soft-tissue]
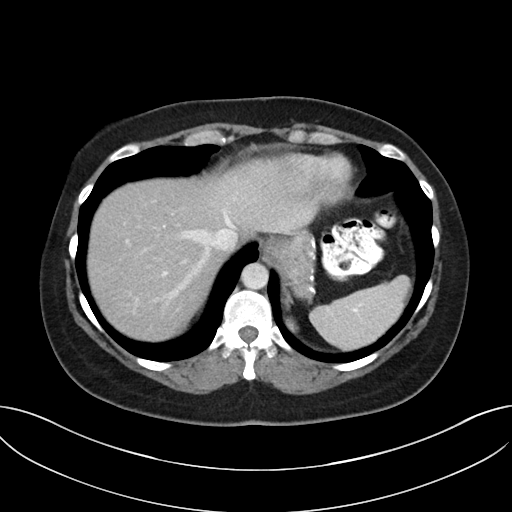

[Series 5: coronal · coronal · 0.91mm/px · 3 of 97 slices shown]
[im 33/97  soft-tissue]
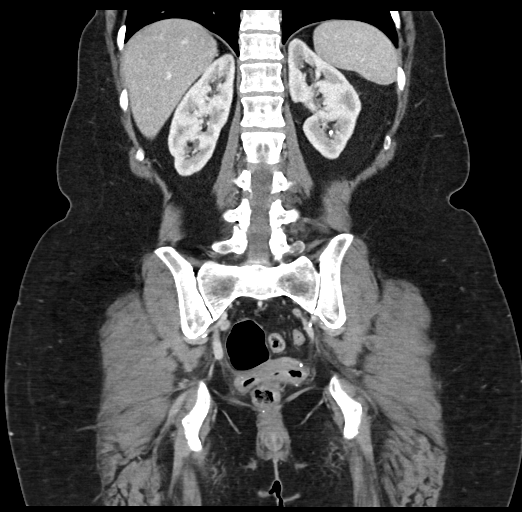
[im 43/97  soft-tissue]
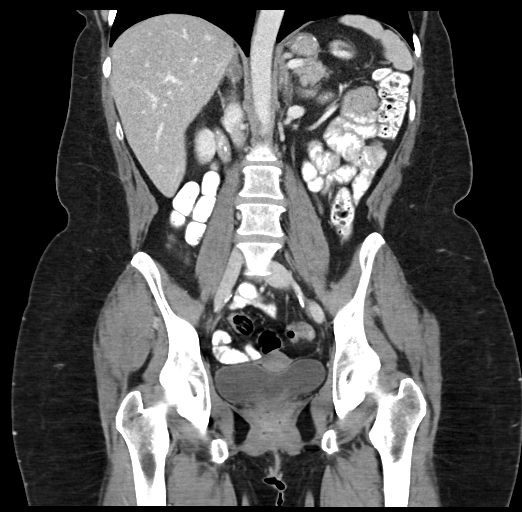
[im 54/97  soft-tissue]
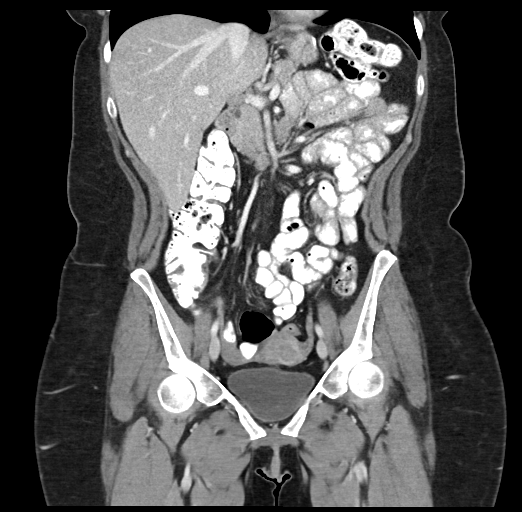

[16 of 46 positions shown; findings below may reference images not displayed]

RADIATION DOSE REDUCTION: This exam was performed according to the
departmental dose-optimization program which includes automated
exposure control, adjustment of the mA and/or kV according to
patient size and/or use of iterative reconstruction technique.

CONTRAST:  100mL OMNIPAQUE IOHEXOL 300 MG/ML  SOLN
FINDINGS: Lower chest: No acute abnormality.

Hepatobiliary: No focal liver abnormality is seen. Status post
cholecystectomy. No biliary dilatation.

Pancreas: Unremarkable. No pancreatic ductal dilatation or
surrounding inflammatory changes.

Spleen: Normal in size without focal abnormality.

Adrenals/Urinary Tract: 12 mm low-density right adrenal nodule
without change and likely representing a small adenoma. Kidneys show
no hydronephrosis. There are multiple small intrarenal stones within
the left kidney. Punctate stones upper pole right kidney.
Redemonstrated 4 mm stone in the distal left ureter near the UVJ
without significant hydronephrosis. Some urothelial enhancement of
the distal ureter.

Stomach/Bowel: Stomach is within normal limits. Status post
appendectomy. No evidence of bowel wall thickening, distention, or
inflammatory changes. Large amount of stool in the colon.

Vascular/Lymphatic: Mild aortic atherosclerosis. No aneurysm. No
suspicious nodes.

Reproductive: No adnexal mass. 2.1 cm enhancing mass off the
posterior uterus consistent with fibroid.

Other: Negative for pelvic effusion or free air.

Musculoskeletal: No acute osseous abnormality.
IMPRESSION: 1. Status post appendectomy without evidence for pelvic abscess or
right lower quadrant inflammation. Negative for bowel obstruction.
Large stool burden
2. 4 mm left distal ureteral stone near the UVJ without significant
hydronephrosis. Similar small bilateral intrarenal stone
3. Small uterine fibroids
